# Patient Record
Sex: Male | Born: 1940 | Race: White | Hispanic: No | Marital: Married | Smoking: Current every day smoker
Health system: Southern US, Community
[De-identification: ages and names within clinical notes are randomized; demographics above are authoritative.]

## PROBLEM LIST (undated history)

## (undated) DIAGNOSIS — C61 Malignant neoplasm of prostate: Secondary | ICD-10-CM

## (undated) DIAGNOSIS — I6529 Occlusion and stenosis of unspecified carotid artery: Secondary | ICD-10-CM

## (undated) DIAGNOSIS — I499 Cardiac arrhythmia, unspecified: Secondary | ICD-10-CM

## (undated) DIAGNOSIS — D369 Benign neoplasm, unspecified site: Secondary | ICD-10-CM

## (undated) DIAGNOSIS — I25810 Atherosclerosis of coronary artery bypass graft(s) without angina pectoris: Secondary | ICD-10-CM

## (undated) DIAGNOSIS — Z87442 Personal history of urinary calculi: Secondary | ICD-10-CM

## (undated) DIAGNOSIS — I2510 Atherosclerotic heart disease of native coronary artery without angina pectoris: Secondary | ICD-10-CM

## (undated) DIAGNOSIS — M1990 Unspecified osteoarthritis, unspecified site: Secondary | ICD-10-CM

## (undated) DIAGNOSIS — I10 Essential (primary) hypertension: Secondary | ICD-10-CM

## (undated) DIAGNOSIS — E785 Hyperlipidemia, unspecified: Secondary | ICD-10-CM

## (undated) DIAGNOSIS — E119 Type 2 diabetes mellitus without complications: Secondary | ICD-10-CM

## (undated) DIAGNOSIS — K219 Gastro-esophageal reflux disease without esophagitis: Secondary | ICD-10-CM

## (undated) HISTORY — DX: Hyperlipidemia, unspecified: E78.5

## (undated) HISTORY — PX: LITHOTRIPSY: SUR834

## (undated) HISTORY — DX: Occlusion and stenosis of unspecified carotid artery: I65.29

## (undated) HISTORY — PX: OTHER SURGICAL HISTORY: SHX169

## (undated) HISTORY — PX: COLON RESECTION: SHX5231

## (undated) HISTORY — PX: CORONARY ARTERY BYPASS GRAFT: SHX141

---

## 2004-09-29 ENCOUNTER — Ambulatory Visit: Payer: Self-pay

## 2006-02-01 ENCOUNTER — Ambulatory Visit: Payer: Self-pay | Admitting: Gastroenterology

## 2007-03-25 ENCOUNTER — Ambulatory Visit: Payer: Self-pay | Admitting: Gastroenterology

## 2008-09-28 ENCOUNTER — Emergency Department: Payer: Self-pay | Admitting: Emergency Medicine

## 2008-11-14 ENCOUNTER — Ambulatory Visit: Payer: Self-pay | Admitting: Urology

## 2008-11-19 ENCOUNTER — Ambulatory Visit: Payer: Self-pay | Admitting: Urology

## 2008-11-20 ENCOUNTER — Ambulatory Visit: Payer: Self-pay | Admitting: Urology

## 2008-11-29 ENCOUNTER — Ambulatory Visit: Payer: Self-pay | Admitting: Urology

## 2008-12-14 ENCOUNTER — Ambulatory Visit: Payer: Self-pay | Admitting: Urology

## 2009-01-15 ENCOUNTER — Ambulatory Visit: Payer: Self-pay | Admitting: Urology

## 2009-02-04 ENCOUNTER — Ambulatory Visit: Payer: Self-pay | Admitting: Urology

## 2009-02-14 ENCOUNTER — Ambulatory Visit: Payer: Self-pay | Admitting: Gastroenterology

## 2012-05-06 ENCOUNTER — Ambulatory Visit: Payer: Self-pay | Admitting: Gastroenterology

## 2012-05-09 LAB — PATHOLOGY REPORT

## 2014-10-08 ENCOUNTER — Ambulatory Visit: Payer: Self-pay | Admitting: Urology

## 2014-10-10 ENCOUNTER — Ambulatory Visit: Payer: Self-pay | Admitting: Urology

## 2014-10-16 ENCOUNTER — Ambulatory Visit: Payer: Self-pay | Admitting: Urology

## 2014-10-23 ENCOUNTER — Ambulatory Visit: Payer: Self-pay | Admitting: Urology

## 2014-12-17 LAB — SURGICAL PATHOLOGY

## 2014-12-23 NOTE — H&P (Signed)
PATIENT NAME:  Kenneth [name], Kenneth [name] MR#:  [id number] DATE OF BIRTH:  04/28/1941  DATE OF ADMISSION:  10/23/2014  CHIEF COMPLAINT: Prostate cancer.   HISTORY OF PRESENT ILLNESS: Mr. 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 Staging studies included CT scan and bone scan revealing metastatic disease to the pelvic lymph nodes. The patient is currently on Casodex 50 mg a day and finasteride 5 mg per day. He comes in today for bilateral orchiectomy.   ALLERGIES: PERCOCET.   CURRENT MEDICATIONS: Include lovastatin, atenolol, Prilosec, aspirin, finasteride and Casodex.   PAST SURGICAL HISTORY: Include coronary artery bypass graft x2 in 1996 and colon resection due to colon polyps in 2001.   SOCIAL HISTORY: The patient smokes half-pack a day and has a 40 pack-year history. He consumes 2 to 4 alcoholic beverages per week.   FAMILY HISTORY: Remarkable for heart disease.   PAST AND CURRENT MEDICAL CONDITIONS:  1.  Coronary artery disease.  2.  Hypercholesterolemia.  3.  Hypertension. 4.  GERD.   REVIEW OF SYSTEMS: The patient denied weight loss, bone pain, fatigue, night sweats, chest pain, shortness of breath, diabetes or stroke.   PHYSICAL EXAMINATION: GENERAL: Elderly white male in no acute distress.  HEENT: Sclerae were clear. Pupils were equally round, reactive to light and accommodation. Extraocular movements were intact.  NECK: Supple. No palpable cervical lymphadenopathy. No audible carotid bruits.  LUNGS: Clear to auscultation.  CARDIOVASCULAR: Regular rhythm and rate without audible murmurs.  ABDOMEN: Soft, nontender abdomen.  GENITOURINARY: Circumcised. Testes smooth and nontender, 20 mL in size each.  RECTAL: 45 gram smooth nontender prostate.  NEUROMUSCULAR: Alert and oriented x3.   IMPRESSION: High-grade and metastatic prostate cancer.    PLAN: Bilateral orchiectomy.  ____________________________ Otelia Limes. Yves Dill, MD [email or web address] D: 10/16/2014 11:49:43 ET T: 10/16/2014 12:05:33 ET JOB#: [id number]  cc: Otelia Limes. Yves Dill, MD, <Dictator> Royston Cowper MD ELECTRONICALLY SIGNED 10/16/2014 15:44

## 2014-12-23 NOTE — Op Note (Signed)
PATIENT NAME:  Kenneth [name], Kenneth [name] MR#:  [id number] DATE OF BIRTH:  1941/04/30  DATE OF PROCEDURE:  10/23/2014  PREOPERATIVE DIAGNOSIS: Prostate cancer.   POSTOPERATIVE DIAGNOSIS: Prostate cancer.   PROCEDURE: Bilateral orchiectomy.   SURGEON: Maryan Puls, MD.   ANESTHETIST:  Dr. Kayleen Memos and Dr. Yves Dill.   ANESTHETIC METHOD: General per Dr. Kayleen Memos and spermatic cord block per Dr. Yves Dill.   INDICATIONS: See the dictated history and physical. After informed consent the patient requested the above procedure.   OPERATIVE SUMMARY: After adequate general anesthesia had been obtained perineum was prepped and draped in the usual fashion. Bilateral spermatic cord blocks were performed with a solution of 1% Xylocaine and 0.5% Marcaine. A total of 10 mL was injected into each spermatic cord. After adequate local anesthesia had been obtained midline raphe scrotal incision was made and carried down sharply through the skin and through the dartos fascia. The right testicle was delivered into the surgical field. The right vas deferens was identified, divided, and ligated with 2-0 Chromic suture. The right spermatic cord vascular structures were then sequentially suture ligated with a 2-0 Chromic suture. The procedure was repeated on the left side in an identical fashion. At this point dartos fascia was approximated with running 2-0 Chromic suture. Scrotal skin was reapproximated with interrupted 2-0 Chromic suture. Sterile dressing and scrotal supporter were applied. Sponge, needle, and instrument counts were noted to be correct. The patient was then transferred to the recovery room in stable condition.    ____________________________ Otelia Limes. Yves Dill, MD [email or web address] D: 10/23/2014 16:48:03 ET T: 10/23/2014 17:42:06 ET JOB#: [id number]  cc: Otelia Limes. Yves Dill, MD, <Dictator> Royston Cowper MD ELECTRONICALLY SIGNED 10/25/2014 9:51

## 2015-02-01 ENCOUNTER — Other Ambulatory Visit: Payer: Self-pay | Admitting: Internal Medicine

## 2015-02-01 DIAGNOSIS — R0989 Other specified symptoms and signs involving the circulatory and respiratory systems: Secondary | ICD-10-CM

## 2015-02-05 ENCOUNTER — Ambulatory Visit
Admission: RE | Admit: 2015-02-05 | Discharge: 2015-02-05 | Disposition: A | Discharge status: Home / Self Care | Payer: Medicare Other | Source: Ambulatory Visit | Attending: Internal Medicine | Admitting: Internal Medicine

## 2015-02-05 DIAGNOSIS — E119 Type 2 diabetes mellitus without complications: Secondary | ICD-10-CM | POA: Insufficient documentation

## 2015-02-05 DIAGNOSIS — I6523 Occlusion and stenosis of bilateral carotid arteries: Secondary | ICD-10-CM | POA: Diagnosis not present

## 2015-02-05 DIAGNOSIS — R0989 Other specified symptoms and signs involving the circulatory and respiratory systems: Secondary | ICD-10-CM

## 2015-02-15 ENCOUNTER — Other Ambulatory Visit: Payer: Self-pay | Admitting: Internal Medicine

## 2015-02-15 DIAGNOSIS — R0989 Other specified symptoms and signs involving the circulatory and respiratory systems: Secondary | ICD-10-CM

## 2015-06-21 ENCOUNTER — Encounter: Payer: Self-pay | Admitting: *Deleted

## 2015-06-24 ENCOUNTER — Encounter
Admission: RE | Disposition: A | Discharge status: Home / Self Care | Payer: Self-pay | Source: Ambulatory Visit | Attending: Gastroenterology

## 2015-06-24 ENCOUNTER — Ambulatory Visit: Payer: Medicare Other | Admitting: *Deleted

## 2015-06-24 ENCOUNTER — Ambulatory Visit
Admission: RE | Admit: 2015-06-24 | Discharge: 2015-06-24 | Disposition: A | Discharge status: Home / Self Care | Payer: Medicare Other | Source: Ambulatory Visit | Attending: Gastroenterology | Admitting: Gastroenterology

## 2015-06-24 DIAGNOSIS — E669 Obesity, unspecified: Secondary | ICD-10-CM | POA: Diagnosis not present

## 2015-06-24 DIAGNOSIS — D120 Benign neoplasm of cecum: Secondary | ICD-10-CM | POA: Insufficient documentation

## 2015-06-24 DIAGNOSIS — Z885 Allergy status to narcotic agent status: Secondary | ICD-10-CM | POA: Insufficient documentation

## 2015-06-24 DIAGNOSIS — Z79899 Other long term (current) drug therapy: Secondary | ICD-10-CM | POA: Diagnosis not present

## 2015-06-24 DIAGNOSIS — D123 Benign neoplasm of transverse colon: Secondary | ICD-10-CM | POA: Diagnosis not present

## 2015-06-24 DIAGNOSIS — D125 Benign neoplasm of sigmoid colon: Secondary | ICD-10-CM | POA: Diagnosis not present

## 2015-06-24 DIAGNOSIS — D122 Benign neoplasm of ascending colon: Secondary | ICD-10-CM | POA: Insufficient documentation

## 2015-06-24 DIAGNOSIS — K5730 Diverticulosis of large intestine without perforation or abscess without bleeding: Secondary | ICD-10-CM | POA: Diagnosis not present

## 2015-06-24 DIAGNOSIS — Z1211 Encounter for screening for malignant neoplasm of colon: Secondary | ICD-10-CM | POA: Diagnosis not present

## 2015-06-24 DIAGNOSIS — E119 Type 2 diabetes mellitus without complications: Secondary | ICD-10-CM | POA: Diagnosis not present

## 2015-06-24 DIAGNOSIS — Z86010 Personal history of colonic polyps: Secondary | ICD-10-CM | POA: Insufficient documentation

## 2015-06-24 DIAGNOSIS — Z7982 Long term (current) use of aspirin: Secondary | ICD-10-CM | POA: Diagnosis not present

## 2015-06-24 DIAGNOSIS — Z6829 Body mass index (BMI) 29.0-29.9, adult: Secondary | ICD-10-CM | POA: Insufficient documentation

## 2015-06-24 DIAGNOSIS — D128 Benign neoplasm of rectum: Secondary | ICD-10-CM | POA: Insufficient documentation

## 2015-06-24 HISTORY — DX: Type 2 diabetes mellitus without complications: E11.9

## 2015-06-24 HISTORY — DX: Malignant neoplasm of prostate: C61

## 2015-06-24 HISTORY — PX: COLONOSCOPY WITH PROPOFOL: SHX5780

## 2015-06-24 HISTORY — DX: Atherosclerosis of coronary artery bypass graft(s) without angina pectoris: I25.810

## 2015-06-24 HISTORY — DX: Benign neoplasm, unspecified site: D36.9

## 2015-06-24 LAB — GLUCOSE, CAPILLARY: Glucose-Capillary: 225 mg/dL — ABNORMAL HIGH (ref 65–99)

## 2015-06-24 SURGERY — COLONOSCOPY WITH PROPOFOL
Anesthesia: General

## 2015-06-24 MED ORDER — SODIUM CHLORIDE 0.9 % IV SOLN
INTRAVENOUS | Status: DC
  Administered 2015-06-24: 09:00:00 via INTRAVENOUS

## 2015-06-24 MED ORDER — PROPOFOL 500 MG/50ML IV EMUL
INTRAVENOUS | Status: DC | PRN
Start: 2015-06-24 — End: 2015-06-24
  Administered 2015-06-24: 120 ug/kg/min via INTRAVENOUS

## 2015-06-24 NOTE — Anesthesia Preprocedure Evaluation (Signed)
Anesthesia Evaluation  Patient identified by MRN, date of birth, ID band Patient awake    Reviewed: Allergy & Precautions, NPO status , Patient's Chart, lab work & pertinent test results  Airway Mallampati: II  TM Distance: >3 FB Neck ROM: Limited    Dental  (+) Edentulous Upper, Edentulous Lower   Pulmonary    Pulmonary exam normal        Cardiovascular Exercise Tolerance: Good hypertension, Pt. on medications and Pt. on home beta blockers + CAD and + CABG  Normal cardiovascular exam  CABG in the 1990s and says he has done well since--can walk up a flight of stairs.   Neuro/Psych    GI/Hepatic   Endo/Other  diabetes, Type 2, Oral Hypoglycemic Agents  Renal/GU      Musculoskeletal   Abdominal (+) + obese,  Abdomen: soft.    Peds  Hematology   Anesthesia Other Findings   Reproductive/Obstetrics                             Anesthesia Physical Anesthesia Plan  ASA: III  Anesthesia Plan: General   Post-op Pain Management:    Induction: Intravenous  Airway Management Planned: Nasal Cannula  Additional Equipment:   Intra-op Plan:   Post-operative Plan:   Informed Consent: I have reviewed the patients History and Physical, chart, labs and discussed the procedure including the risks, benefits and alternatives for the proposed anesthesia with the patient or authorized representative who has indicated his/her understanding and acceptance.     Plan Discussed with:   Anesthesia Plan Comments:         Anesthesia Quick Evaluation

## 2015-06-24 NOTE — Transfer of Care (Signed)
Immediate Anesthesia Transfer of Care Note  Patient: Kenneth [name]  Procedure(s) Performed: Procedure(s): COLONOSCOPY WITH PROPOFOL (N/A)  Patient Location: PACU and Endoscopy Unit  Anesthesia Type:General  Level of Consciousness: awake, alert  and oriented  Airway & Oxygen Therapy: Patient Spontanous Breathing and Patient connected to nasal cannula oxygen  Post-op Assessment: Report given to RN and Post -op Vital signs reviewed and stable  Post vital signs: Reviewed and stable  Last Vitals:  Filed Vitals:   06/24/15 0938  BP: 107/56  Pulse: 55  Temp: 35.8 C  Resp: 16    Complications: No apparent anesthesia complications

## 2015-06-24 NOTE — Op Note (Signed)
[facility] Gastroenterology Patient Name: Kenneth [name] Procedure Date: 06/24/2015 8:59 AM MRN: [id number] Account #: [id number] Date of Birth: [birth year] Admit Type: Outpatient Age: 74 Room: [facility] ENDO ROOM 3 Gender: Male Note Status: Finalized Procedure:         Colonoscopy Indications:       Personal history of colonic polyps Providers:         Lollie Sails, MD Referring MD:      Leona Carry. Hall Busing, MD (Referring MD) Medicines:         Monitored Anesthesia Care Complications:     No immediate complications. Procedure:         Pre-Anesthesia Assessment:                    - ASA Grade Assessment: III - A patient with severe                     systemic disease.                    After obtaining informed consent, the colonoscope was                     passed under direct vision. Throughout the procedure, the                     patient's blood pressure, pulse, and oxygen saturations                     were monitored continuously. The Colonoscope was                     introduced through the anus and advanced to the the cecum,                     identified by appendiceal orifice and ileocecal valve. The                     colonoscopy was performed without difficulty. The patient                     tolerated the procedure well. The patient tolerated the                     procedure well. The quality of the bowel preparation was                     good except the ascending colon was fair. Findings:      A 3 mm polyp was found in the recto-sigmoid colon. The polyp was       sessile. The polyp was removed with a cold biopsy forceps. Resection and       retrieval were complete.      A 2 mm polyp was found in the sigmoid colon. The polyp was flat. The       polyp was removed with a cold biopsy forceps. Resection and retrieval       were complete.      A 3 mm polyp was found in the cecum. The polyp was flat. The polyp was       removed with a cold biopsy  forceps. Resection and retrieval were       complete.      A 2 mm polyp was found in the ascending colon. The polyp was flat. The  polyp was removed with a cold biopsy forceps. Resection and retrieval       were complete.      A 2 mm polyp was found in the transverse colon. The polyp was sessile.       The polyp was removed with a cold biopsy forceps. Resection and       retrieval were complete.      Two sessile polyps were found in the distal sigmoid colon. The polyps       were 1 to 2 mm in size.      A few small-mouthed diverticula were found in the descending colon, in       the transverse colon and in the ascending colon.      the previously noted colonic anastomosis in the sigmoid colon was       inapparent on examination today Impression:        - One 3 mm polyp at the recto-sigmoid colon. Resected and                     retrieved.                    - One 2 mm polyp in the sigmoid colon. Resected and                     retrieved.                    - One 3 mm polyp in the cecum. Resected and retrieved.                    - One 2 mm polyp in the ascending colon. Resected and                     retrieved.                    - One 2 mm polyp in the transverse colon. Resected and                     retrieved.                    - Two 1 to 2 mm polyps in the distal sigmoid colon.                    - Diverticulosis in the descending colon, in the                     transverse colon and in the ascending colon. Recommendation:    - Await pathology results.                    - Telephone GI clinic for pathology results in 1 week. Procedure Code(s): --- Professional ---                    [phone], Colonoscopy, flexible; with biopsy, single or                     multiple Diagnosis Code(s): --- Professional ---                    211.4, Benign neoplasm of rectum and anal canal                    211.3, Benign neoplasm  of colon                    V12.72, Personal history of  colonic polyps                    562.10, Diverticulosis of colon (without mention of                     hemorrhage) CPT copyright 2014 American Medical Association. All rights reserved. The codes documented in this report are preliminary and upon coder review may  be revised to meet current compliance requirements. Lollie Sails, MD 06/24/2015 9:34:47 AM This report has been signed electronically. Number of Addenda: 0 Note Initiated On: 06/24/2015 8:59 AM Scope Withdrawal Time: 0 hours 14 minutes 23 seconds  Total Procedure Duration: 0 hours 20 minutes 44 seconds       [facility]

## 2015-06-24 NOTE — Anesthesia Postprocedure Evaluation (Signed)
  Anesthesia Post-op Note  Patient: Kenneth [name]  Procedure(s) Performed: Procedure(s): COLONOSCOPY WITH PROPOFOL (N/A)  Anesthesia type:General  Patient location: PACU  Post pain: Pain level controlled  Post assessment: Post-op Vital signs reviewed, Patient's Cardiovascular Status Stable, Respiratory Function Stable, Patent Airway and No signs of Nausea or vomiting  Post vital signs: Reviewed and stable  Last Vitals:  Filed Vitals:   06/24/15 0950  BP: 121/61  Pulse: 52  Temp:   Resp: 22    Level of consciousness: awake, alert  and patient cooperative  Complications: No apparent anesthesia complications

## 2015-06-24 NOTE — H&P (Signed)
Outpatient short stay form Pre-procedure 06/24/2015 8:56 AM Kenneth Sails MD  Primary Physician: Dr. Benita Stabile  Reason for visit:  Colonoscopy  History of present illness:  Patient is a 74 year old male presenting today for a colonoscopy. He has a history of adenomatous colon polyps. He had a partial colectomy, sigmoid, several years ago due to a endoscopically nonresectable polyp. Tolerated his prep well. He takes no blood thinners with the exception of a minidose aspirin. He is held that.    Current facility-administered medications:  .  0.9 %  sodium chloride infusion, , Intravenous, Continuous, Kenneth Sails, MD, Last Rate: 20 mL/hr at 06/24/15 8469  Prescriptions prior to admission  Medication Sig Dispense Refill Last Dose  . aspirin 81 MG tablet Take 81 mg by mouth daily.     Marland Kitchen atenolol (TENORMIN) 25 MG tablet Take 25 mg by mouth daily.   06/24/2015 at 0700  . finasteride (PROSCAR) 5 MG tablet Take 5 mg by mouth daily.     Marland Kitchen lovastatin (MEVACOR) 20 MG tablet Take 20 mg by mouth at bedtime.     Marland Kitchen omeprazole (PRILOSEC) 20 MG capsule Take 20 mg by mouth every other day.     . sitaGLIPtin (JANUVIA) 100 MG tablet Take 100 mg by mouth daily.     . tamsulosin (FLOMAX) 0.4 MG CAPS capsule Take 0.4 mg by mouth.        Allergies  Allergen Reactions  . Percocet [Oxycodone-Acetaminophen]      Past Medical History  Diagnosis Date  . Adenomatous polyps   . Diabetes mellitus without complication ([place])   . Prostate cancer ([place])   . Prostate cancer ([place])   . Coronary atherosclerosis of artery bypass graft     Review of systems:      Physical Exam    Heart and lungs: Regular rate and rhythm without rub or gallop, lungs are bilaterally clear    HEENT: Normocephalic atraumatic eyes are anicteric    Other:     Pertinant exam for procedure: Soft nontender nondistended bowel sounds positive normoactive    Planned proceedures: Colonoscopy and indicated procedures I  have discussed the risks benefits and complications of procedures to include not limited to bleeding, infection, perforation and the risk of sedation and the patient wishes to proceed.    Kenneth Sails, MD Gastroenterology 06/24/2015  8:56 AM

## 2015-06-25 ENCOUNTER — Encounter: Payer: Self-pay | Admitting: Gastroenterology

## 2015-06-26 LAB — SURGICAL PATHOLOGY

## 2015-08-09 ENCOUNTER — Ambulatory Visit
Admission: RE | Admit: 2015-08-09 | Discharge: 2015-08-09 | Disposition: A | Discharge status: Home / Self Care | Payer: Medicare Other | Source: Ambulatory Visit | Attending: Internal Medicine | Admitting: Internal Medicine

## 2015-08-09 DIAGNOSIS — I6523 Occlusion and stenosis of bilateral carotid arteries: Secondary | ICD-10-CM | POA: Diagnosis not present

## 2015-08-09 DIAGNOSIS — R0989 Other specified symptoms and signs involving the circulatory and respiratory systems: Secondary | ICD-10-CM | POA: Insufficient documentation

## 2015-08-27 ENCOUNTER — Other Ambulatory Visit: Payer: Self-pay | Admitting: Vascular Surgery

## 2015-08-27 DIAGNOSIS — I6523 Occlusion and stenosis of bilateral carotid arteries: Secondary | ICD-10-CM

## 2015-09-02 ENCOUNTER — Ambulatory Visit
Admission: RE | Admit: 2015-09-02 | Discharge: 2015-09-02 | Disposition: A | Discharge status: Home / Self Care | Payer: Medicare Other | Source: Ambulatory Visit | Attending: Vascular Surgery | Admitting: Vascular Surgery

## 2015-09-02 DIAGNOSIS — I6523 Occlusion and stenosis of bilateral carotid arteries: Secondary | ICD-10-CM

## 2015-09-02 DIAGNOSIS — I6521 Occlusion and stenosis of right carotid artery: Secondary | ICD-10-CM | POA: Diagnosis not present

## 2015-09-02 DIAGNOSIS — I6501 Occlusion and stenosis of right vertebral artery: Secondary | ICD-10-CM | POA: Diagnosis not present

## 2015-09-02 LAB — POCT I-STAT CREATININE: CREATININE: 0.8 mg/dL (ref 0.61–1.24)

## 2015-09-02 MED ORDER — IOHEXOL 350 MG/ML SOLN
75.0000 mL | Freq: Once | INTRAVENOUS | Status: AC | PRN
  Administered 2015-09-02: 75 mL via INTRAVENOUS

## 2015-09-06 ENCOUNTER — Encounter: Payer: Self-pay | Admitting: Cardiovascular Disease

## 2015-09-06 ENCOUNTER — Ambulatory Visit (INDEPENDENT_AMBULATORY_CARE_PROVIDER_SITE_OTHER): Payer: Medicare Other | Admitting: Cardiovascular Disease

## 2015-09-06 VITALS — BP 124/60 | HR 56 | Ht 69.0 in | Wt 212.5 lb

## 2015-09-06 DIAGNOSIS — R5383 Other fatigue: Secondary | ICD-10-CM | POA: Diagnosis not present

## 2015-09-06 DIAGNOSIS — Z01810 Encounter for preprocedural cardiovascular examination: Secondary | ICD-10-CM

## 2015-09-06 DIAGNOSIS — E785 Hyperlipidemia, unspecified: Secondary | ICD-10-CM

## 2015-09-06 DIAGNOSIS — Z01818 Encounter for other preprocedural examination: Secondary | ICD-10-CM | POA: Diagnosis not present

## 2015-09-06 NOTE — Assessment & Plan Note (Signed)
The patient has no symptoms of angina and he is physically active with no significant exertional dyspnea. Nonetheless, he had CABG in 1996 and has old grafts with no recent ischemic cardiac evaluation. Thus, I recommend a pharmacologic nuclear stress test for evaluation. If this comes back unremarkable, he can proceed with carotid endarterectomy at an overall low risk. Continue low-dose aspirin.

## 2015-09-06 NOTE — Patient Instructions (Addendum)
Medication Instructions:  Your physician recommends that you continue on your current medications as directed. Please refer to the Current Medication list given to you today.   Labwork: none  Testing/Procedures: Your physician has requested that you have a lexiscan myoview. For further information please visit [email or web address]. Please follow instruction sheet, as given.    [place]  Your caregiver has ordered a Stress Test with nuclear imaging. The purpose of this test is to evaluate the blood supply to your heart muscle. This procedure is referred to as a "Non-Invasive Stress Test." This is because other than having an IV started in your vein, nothing is inserted or "invades" your body. Cardiac stress tests are done to find areas of poor blood flow to the heart by determining the extent of coronary artery disease (CAD). Some patients exercise on a treadmill, which naturally increases the blood flow to your heart, while others who are  unable to walk on a treadmill due to physical limitations have a pharmacologic/chemical stress agent called Lexiscan . This medicine will mimic walking on a treadmill by temporarily increasing your coronary blood flow.   Please note: these test may take anywhere between 2-4 hours to complete  PLEASE REPORT TO [place] AT THE FIRST DESK WILL DIRECT YOU WHERE TO GO  Date of Procedure:___Tuesday, Jan 17_______________  Arrival Time for Procedure:__________8:45am____________  Instructions regarding medication:   ___xx_ : Hold diabetes medication morning of procedure  _xx___:  Hold atenolol night before procedure and morning of procedure    PLEASE NOTIFY THE OFFICE AT LEAST 24 HOURS IN ADVANCE IF YOU ARE UNABLE TO KEEP YOUR APPOINTMENT.  [phone] AND  PLEASE NOTIFY NUCLEAR MEDICINE AT [facility] AT LEAST 24 HOURS IN ADVANCE IF YOU ARE UNABLE TO KEEP YOUR APPOINTMENT. [phone]  How to prepare for your Myoview  test:   Do not eat or drink after midnight  No caffeine for 24 hours prior to test  No smoking 24 hours prior to test.  Your medication may be taken with water.  If your doctor stopped a medication because of this test, do not take that medication.  Ladies, please do not wear dresses.  Skirts or pants are appropriate. Please wear a short sleeve shirt.  No perfume, cologne or lotion.  Wear comfortable walking shoes. No heels!            Follow-Up: Your physician recommends that you schedule a follow-up appointment as needed.   Any Other Special Instructions Will Be Listed Below (If Applicable).     If you need a refill on your cardiac medications before your next appointment, please call your pharmacy.  Cardiac Nuclear Scanning A cardiac nuclear scan is used to check your heart for problems, such as the following:  A portion of the heart is not getting enough blood.  Part of the heart muscle has died, which happens with a heart attack.  The heart wall is not working normally.  In this test, a radioactive dye (tracer) is injected into your bloodstream. After the tracer has traveled to your heart, a scanning device is used to measure how much of the tracer is absorbed by or distributed to various areas of your heart. LET [facility] CARE PROVIDER KNOW ABOUT:  Any allergies you have.  All medicines you are taking, including vitamins, herbs, eye drops, creams, and over-the-counter medicines.  Previous problems you or members of your family have had with the use of anesthetics.  Any blood disorders  you have.  Previous surgeries you have had.  Medical conditions you have.  RISKS AND COMPLICATIONS Generally, this is a safe procedure. However, as with any procedure, problems can occur. Possible problems include:   Serious chest pain.  Rapid heartbeat.  Sensation of warmth in your chest. This usually passes quickly. BEFORE THE PROCEDURE Ask your health care  provider about changing or stopping your regular medicines. PROCEDURE This procedure is usually done at a hospital and takes 2-4 hours.  An IV tube is inserted into one of your veins.  Your health care provider will inject a small amount of radioactive tracer through the tube.  You will then wait for 20-40 minutes while the tracer travels through your bloodstream.  You will lie down on an exam table so images of your heart can be taken. Images will be taken for about 15-20 minutes.  You will exercise on a treadmill or stationary bike. While you exercise, your heart activity will be monitored with an electrocardiogram (ECG), and your blood pressure will be checked.  If you are unable to exercise, you may be given a medicine to make your heart beat faster.  When blood flow to your heart has peaked, tracer will again be injected through the IV tube.  After 20-40 minutes, you will get back on the exam table and have more images taken of your heart.  When the procedure is over, your IV tube will be removed. AFTER THE PROCEDURE  You will likely be able to leave shortly after the test. Unless your health care provider tells you otherwise, you may return to your normal schedule, including diet, activities, and medicines.  Make sure you find out how and when you will get your test results.   This information is not intended to replace advice given to you by your health care provider. Make sure you discuss any questions you have with your health care provider.   Document Released: 09/04/2004 Document Revised: 08/15/2013 Document Reviewed: 07/19/2013 Elsevier Interactive Patient Education [employer].

## 2015-09-06 NOTE — Assessment & Plan Note (Signed)
Continue treatment with lovastatin with a target LDL of less than 70.

## 2015-09-06 NOTE — Progress Notes (Signed)
Primary care physician: Dr. Hall Busing.   HPI  This is a pleasant 75 year old male who was referred by Dr. Lucky Cowboy for preoperative cardiovascular evaluation before carotid endarterectomy. The patient has known history of coronary artery disease status post CABG in 1996 at [facility] with no cardiac events since then. He has other chronic medical conditions that include hyperlipidemia, type 2 diabetes and tobacco use. He also had prostate cancer and had partial colectomy in the past. He was found to have a recent right carotid bruit. Duplex confirmed high-grade stenosis. He denies any chest pain or significant shortness of breath. He is very active and plays golf on a regular basis with no significant exertional symptoms. He reports having no stress test in the last 5 years. He continues to smoke about 13 cigarettes a day and has been doing so since he was 75 years old. He drinks beer on occasions but not very frequently.  Allergies  Allergen Reactions  . Percocet [Oxycodone-Acetaminophen]      Current Outpatient Prescriptions on File Prior to Visit  Medication Sig Dispense Refill  . aspirin 81 MG tablet Take 81 mg by mouth daily.    Marland Kitchen atenolol (TENORMIN) 25 MG tablet Take 25 mg by mouth daily.    . finasteride (PROSCAR) 5 MG tablet Take 5 mg by mouth daily.    Marland Kitchen lovastatin (MEVACOR) 20 MG tablet Take 20 mg by mouth at bedtime.    Marland Kitchen omeprazole (PRILOSEC) 20 MG capsule Take 20 mg by mouth every other day.    . sitaGLIPtin (JANUVIA) 100 MG tablet Take 100 mg by mouth daily.    . tamsulosin (FLOMAX) 0.4 MG CAPS capsule Take 0.4 mg by mouth.     No current facility-administered medications on file prior to visit.     Past Medical History  Diagnosis Date  . Adenomatous polyps   . Diabetes mellitus without complication ([place])   . Coronary atherosclerosis of artery bypass graft   . Prostate cancer ([place])   . Prostate cancer ([place])   . Hyperlipidemia   . Carotid artery occlusion      Past Surgical  History  Procedure Laterality Date  . Removal of testicles    . Colonoscopy with propofol N/A 06/24/2015    Procedure: COLONOSCOPY WITH PROPOFOL;  Surgeon: Lollie Sails, MD;  Location: [facility] ENDOSCOPY;  Service: Endoscopy;  Laterality: N/A;  . Coronary artery bypass graft      1996 at [place] History  Problem Relation Age of Onset  . Family history: No family history of coronary artery disease or heart failure.      Social History   Social History  . Marital Status: Married    Spouse Name: N/A  . Number of Children: N/A  . Years of Education: N/A   Occupational History  . Not on file.   Social History Main Topics  . Smoking status: Current Every Day Smoker -- 15.00 packs/day for 50 years    Types: Cigarettes  . Smokeless tobacco: Not on file  . Alcohol Use: No  . Drug Use: No  . Sexual Activity: Not on file   Other Topics Concern  . Not on file   Social History Narrative     ROS A 10 point review of system was performed. It is negative other than that mentioned in the history of present illness.   PHYSICAL EXAM   BP 124/60 mmHg  Pulse 56  Ht 5\' 9"  (1.753 m)  Wt 212 lb  8 oz (96.389 kg)  BMI 31.37 kg/m2 Constitutional: He is oriented to person, place, and time. He appears well-developed and well-nourished. No distress.  HENT: No nasal discharge.  Head: Normocephalic and atraumatic.  Eyes: Pupils are equal and round.  No discharge. Neck: Normal range of motion. Neck supple. No JVD present. No thyromegaly present. Right carotid bruit. Cardiovascular: Normal rate, regular rhythm, normal heart sounds. Exam reveals no gallop and no friction rub. No murmur heard.  Pulmonary/Chest: Effort normal and breath sounds normal. No stridor. No respiratory distress. He has no wheezes. He has no rales. He exhibits no tenderness.  Abdominal: Soft. Bowel sounds are normal. He exhibits no distension. There is no tenderness. There is no rebound and no guarding.    Musculoskeletal: Normal range of motion. He exhibits no edema and no tenderness.  Neurological: He is alert and oriented to person, place, and time. Coordination normal.  Skin: Skin is warm and dry. No rash noted. He is not diaphoretic. No erythema. No pallor.  Psychiatric: He has a normal mood and affect. His behavior is normal. Judgment and thought content normal.       EKG: Sinus bradycardia with no significant ST or T wave changes.   ASSESSMENT AND PLAN

## 2015-09-09 ENCOUNTER — Telehealth: Payer: Self-pay

## 2015-09-09 NOTE — Telephone Encounter (Signed)
Reviewed lexi myoview instructions w/pt who verbalized understanding. Will cardiac clearance sent to [facility], Dr. Lucky Cowboy, after test.

## 2015-09-10 ENCOUNTER — Ambulatory Visit
Admission: RE | Admit: 2015-09-10 | Discharge: 2015-09-10 | Disposition: A | Discharge status: Home / Self Care | Payer: Medicare Other | Source: Ambulatory Visit | Attending: Cardiovascular Disease | Admitting: Cardiovascular Disease

## 2015-09-10 DIAGNOSIS — Z79899 Other long term (current) drug therapy: Secondary | ICD-10-CM

## 2015-09-10 DIAGNOSIS — Z01818 Encounter for other preprocedural examination: Secondary | ICD-10-CM | POA: Diagnosis not present

## 2015-09-10 LAB — NM MYOCAR MULTI W/SPECT W/WALL MOTION / EF
CHL CUP NUCLEAR SSS: 25
CHL CUP STRESS STAGE 1 HR: 60 {beats}/min
CHL CUP STRESS STAGE 2 SPEED: 0 mph
CHL CUP STRESS STAGE 3 HR: 60 {beats}/min
CHL CUP STRESS STAGE 3 SPEED: 0 mph
CHL CUP STRESS STAGE 4 GRADE: 0 %
CHL CUP STRESS STAGE 5 GRADE: 0 %
CHL CUP STRESS STAGE 5 SPEED: 0 mph
CSEPEW: 1 METS
CSEPHR: 75 %
CSEPPHR: 109 {beats}/min
CSEPPMHR: 75 %
LVDIAVOL: 159 mL
LVSYSVOL: 77 mL
NUC STRESS TID: 0.87
SDS: 3
SRS: 2
Stage 2 Grade: 0 %
Stage 2 HR: 60 {beats}/min
Stage 3 Grade: 0 %
Stage 4 HR: 109 {beats}/min
Stage 4 Speed: 0 mph
Stage 5 DBP: 65 mmHg
Stage 5 HR: 73 {beats}/min
Stage 5 SBP: 165 mmHg

## 2015-09-10 MED ORDER — TECHNETIUM TC 99M SESTAMIBI - CARDIOLITE
12.5100 | Freq: Once | INTRAVENOUS | Status: AC | PRN
  Administered 2015-09-10: 09:00:00 12.51 via INTRAVENOUS

## 2015-09-10 MED ORDER — REGADENOSON 0.4 MG/5ML IV SOLN
0.4000 mg | Freq: Once | INTRAVENOUS | Status: AC
  Administered 2015-09-10: 0.4 mg via INTRAVENOUS

## 2015-09-10 MED ORDER — TECHNETIUM TC 99M SESTAMIBI - CARDIOLITE
32.2380 | Freq: Once | INTRAVENOUS | Status: AC | PRN
  Administered 2015-09-10: 32.238 via INTRAVENOUS

## 2015-09-11 ENCOUNTER — Telehealth: Payer: Self-pay | Admitting: Cardiovascular Disease

## 2015-09-11 NOTE — Telephone Encounter (Signed)
Patient wants nm stress results from yesterday.  Please call .

## 2015-09-11 NOTE — Telephone Encounter (Signed)
See result note.  

## 2015-09-12 ENCOUNTER — Telehealth: Payer: Self-pay

## 2015-09-12 NOTE — Telephone Encounter (Signed)
Faxed clearance to [facility], [phone]

## 2015-09-18 ENCOUNTER — Encounter
Admission: RE | Admit: 2015-09-18 | Discharge: 2015-09-18 | Disposition: A | Discharge status: Home / Self Care | Payer: Medicare Other | Source: Ambulatory Visit | Attending: Vascular Surgery | Admitting: Vascular Surgery

## 2015-09-18 ENCOUNTER — Other Ambulatory Visit: Payer: Self-pay | Admitting: Vascular Surgery

## 2015-09-18 DIAGNOSIS — Z01812 Encounter for preprocedural laboratory examination: Secondary | ICD-10-CM | POA: Insufficient documentation

## 2015-09-18 HISTORY — DX: Gastro-esophageal reflux disease without esophagitis: K21.9

## 2015-09-18 LAB — TYPE AND SCREEN
ABO/RH(D): A NEG
Antibody Screen: NEGATIVE

## 2015-09-18 LAB — CBC WITH DIFFERENTIAL/PLATELET
Basophils Absolute: 0 10*3/uL (ref 0–0.1)
Basophils Relative: 1 %
EOS ABS: 0 10*3/uL (ref 0–0.7)
Eosinophils Relative: 0 %
HEMATOCRIT: 43.8 % (ref 40.0–52.0)
HEMOGLOBIN: 14.7 g/dL (ref 13.0–18.0)
LYMPHS ABS: 2.6 10*3/uL (ref 1.0–3.6)
Lymphocytes Relative: 34 %
MCH: 28.8 pg (ref 26.0–34.0)
MCHC: 33.5 g/dL (ref 32.0–36.0)
MCV: 85.8 fL (ref 80.0–100.0)
MONO ABS: 0.6 10*3/uL (ref 0.2–1.0)
MONOS PCT: 7 %
NEUTROS ABS: 4.3 10*3/uL (ref 1.4–6.5)
NEUTROS PCT: 58 %
Platelets: 136 10*3/uL — ABNORMAL LOW (ref 150–440)
RBC: 5.11 MIL/uL (ref 4.40–5.90)
RDW: 13.5 % (ref 11.5–14.5)
WBC: 7.6 10*3/uL (ref 3.8–10.6)

## 2015-09-18 LAB — BASIC METABOLIC PANEL
Anion gap: 12 (ref 5–15)
BUN: 14 mg/dL (ref 6–20)
CHLORIDE: 103 mmol/L (ref 101–111)
CO2: 22 mmol/L (ref 22–32)
CREATININE: 0.8 mg/dL (ref 0.61–1.24)
Calcium: 9.5 mg/dL (ref 8.9–10.3)
GFR calc non Af Amer: >60 mL/min (ref >60)
GLUCOSE: 175 mg/dL — AB (ref 65–99)
Potassium: 4 mmol/L (ref 3.5–5.1)
Sodium: 137 mmol/L (ref 135–145)

## 2015-09-18 LAB — PROTIME-INR
INR: 1.01
Prothrombin Time: 13.5 seconds (ref 11.4–15.0)

## 2015-09-18 LAB — SURGICAL PCR SCREEN
MRSA, PCR: NEGATIVE
STAPHYLOCOCCUS AUREUS: NEGATIVE

## 2015-09-18 LAB — APTT: aPTT: 27 seconds (ref 24–36)

## 2015-09-18 LAB — ABO/RH: ABO/RH(D): A NEG

## 2015-09-18 NOTE — Pre-Procedure Instructions (Signed)
Kenneth Hampshire, MD at 09/06/2015 9:05 AM     Status: Signed       Expand All Collapse All    Primary care physician: Dr. Hall Busing.   HPI  This is a pleasant 75 year old male who was referred by Dr. Lucky Cowboy for preoperative cardiovascular evaluation before carotid endarterectomy. The patient has known history of coronary artery disease status post CABG in 1996 at [facility] with no cardiac events since then. He has other chronic medical conditions that include hyperlipidemia, type 2 diabetes and tobacco use. He also had prostate cancer and had partial colectomy in the past. He was found to have a recent right carotid bruit. Duplex confirmed high-grade stenosis. He denies any chest pain or significant shortness of breath. He is very active and plays golf on a regular basis with no significant exertional symptoms. He reports having no stress test in the last 5 years. He continues to smoke about 13 cigarettes a day and has been doing so since he was 75 years old. He drinks beer on occasions but not very frequently.  Allergies  Allergen Reactions  . Percocet [Oxycodone-Acetaminophen]      Current Outpatient Prescriptions on File Prior to Visit  Medication Sig Dispense Refill  . aspirin 81 MG tablet Take 81 mg by mouth daily.    Marland Kitchen atenolol (TENORMIN) 25 MG tablet Take 25 mg by mouth daily.    . finasteride (PROSCAR) 5 MG tablet Take 5 mg by mouth daily.    Marland Kitchen lovastatin (MEVACOR) 20 MG tablet Take 20 mg by mouth at bedtime.    Marland Kitchen omeprazole (PRILOSEC) 20 MG capsule Take 20 mg by mouth every other day.    . sitaGLIPtin (JANUVIA) 100 MG tablet Take 100 mg by mouth daily.    . tamsulosin (FLOMAX) 0.4 MG CAPS capsule Take 0.4 mg by mouth.     No current facility-administered medications on file prior to visit.     Past Medical History  Diagnosis Date  . Adenomatous polyps   . Diabetes mellitus without complication ([place])   . Coronary  atherosclerosis of artery bypass graft   . Prostate cancer ([place])   . Prostate cancer ([place])   . Hyperlipidemia   . Carotid artery occlusion

## 2015-09-18 NOTE — Patient Instructions (Signed)
  Your procedure is scheduled on: 09/25/15 Wed Report to Day Surgery.2nd floor medical mall To find out your arrival time please call [phone] between 1PM - 3PM on 09/24/15 Tues.  Remember: Instructions that are not followed completely may result in serious medical risk, up to and including death, or upon the discretion of your surgeon and anesthesiologist your surgery may need to be rescheduled.    _x___ 1. Do not eat food or drink liquids after midnight. No gum chewing or hard candies.     _x___ 2. No Alcohol for 24 hours before or after surgery.   ____ 3. Bring all medications with you on the day of surgery if instructed.    __x__ 4. Notify your doctor if there is any change in your medical condition     (cold, fever, infections).     Do not wear jewelry, make-up, hairpins, clips or nail polish.  Do not wear lotions, powders, or perfumes. You may wear deodorant.  Do not shave 48 hours prior to surgery. Men may shave face and neck.  Do not bring valuables to the hospital.    [facility] is not responsible for any belongings or valuables.               Contacts, dentures or bridgework may not be worn into surgery.  Leave your suitcase in the car. After surgery it may be brought to your room.  For patients admitted to the hospital, discharge time is determined by your                treatment team.   Patients discharged the day of surgery will not be allowed to drive home.   Please read over the following fact sheets that you were given:   MRSA Information   _x___ Take these medicines the morning of surgery with A SIP OF WATER:    1. atenolol (TENORMIN) 25 MG tablet  2. omeprazole (PRILOSEC) 20 MG capsule  3.   4.  5.  6.  ____ Fleet Enema (as directed)   _x___ Use CHG Soap as directed  ____ Use inhalers on the day of surgery  ____ Stop metformin 2 days prior to surgery    ____ Take 1/2 of usual insulin dose the night before surgery and none on the morning of  surgery.   ____ Stop Coumadin/Plavix/aspirin on   ____ Stop Anti-inflammatories on    ____ Stop supplements until after surgery.    ____ Bring C-Pap to the hospital.

## 2015-09-25 ENCOUNTER — Inpatient Hospital Stay: Payer: Medicare Other | Admitting: Anesthesiology

## 2015-09-25 ENCOUNTER — Inpatient Hospital Stay
Admission: RE | Admit: 2015-09-25 | Discharge: 2015-09-26 | DRG: 039 | Disposition: A | Discharge status: Home / Self Care | Payer: Medicare Other | Source: Ambulatory Visit | Attending: Vascular Surgery | Admitting: Vascular Surgery

## 2015-09-25 ENCOUNTER — Encounter
Admission: RE | Disposition: A | Discharge status: Home / Self Care | Payer: Self-pay | Source: Ambulatory Visit | Attending: Vascular Surgery

## 2015-09-25 DIAGNOSIS — Z8546 Personal history of malignant neoplasm of prostate: Secondary | ICD-10-CM | POA: Diagnosis not present

## 2015-09-25 DIAGNOSIS — E119 Type 2 diabetes mellitus without complications: Secondary | ICD-10-CM | POA: Diagnosis present

## 2015-09-25 DIAGNOSIS — Z9889 Other specified postprocedural states: Secondary | ICD-10-CM

## 2015-09-25 DIAGNOSIS — I2510 Atherosclerotic heart disease of native coronary artery without angina pectoris: Secondary | ICD-10-CM | POA: Diagnosis present

## 2015-09-25 DIAGNOSIS — Z79899 Other long term (current) drug therapy: Secondary | ICD-10-CM

## 2015-09-25 DIAGNOSIS — K219 Gastro-esophageal reflux disease without esophagitis: Secondary | ICD-10-CM | POA: Diagnosis present

## 2015-09-25 DIAGNOSIS — I6529 Occlusion and stenosis of unspecified carotid artery: Secondary | ICD-10-CM | POA: Diagnosis present

## 2015-09-25 DIAGNOSIS — Z833 Family history of diabetes mellitus: Secondary | ICD-10-CM

## 2015-09-25 DIAGNOSIS — Z9079 Acquired absence of other genital organ(s): Secondary | ICD-10-CM | POA: Diagnosis not present

## 2015-09-25 DIAGNOSIS — I6521 Occlusion and stenosis of right carotid artery: Secondary | ICD-10-CM | POA: Diagnosis present

## 2015-09-25 DIAGNOSIS — Z7982 Long term (current) use of aspirin: Secondary | ICD-10-CM | POA: Diagnosis not present

## 2015-09-25 DIAGNOSIS — F17210 Nicotine dependence, cigarettes, uncomplicated: Secondary | ICD-10-CM | POA: Diagnosis present

## 2015-09-25 DIAGNOSIS — Z885 Allergy status to narcotic agent status: Secondary | ICD-10-CM | POA: Diagnosis not present

## 2015-09-25 HISTORY — PX: ENDARTERECTOMY: SHX5162

## 2015-09-25 LAB — GLUCOSE, CAPILLARY
GLUCOSE-CAPILLARY: 170 mg/dL — AB (ref 65–99)
Glucose-Capillary: 178 mg/dL — ABNORMAL HIGH (ref 65–99)
Glucose-Capillary: 164 mg/dL — ABNORMAL HIGH (ref 65–99)

## 2015-09-25 LAB — MRSA PCR SCREENING: MRSA BY PCR: NEGATIVE

## 2015-09-25 SURGERY — ENDARTERECTOMY, CAROTID
Anesthesia: General | Laterality: Right | Wound class: Clean

## 2015-09-25 MED ORDER — GLIPIZIDE 5 MG PO TABS
5.0000 mg | ORAL_TABLET | Freq: Every day | ORAL | Status: DC
  Administered 2015-09-26: 5 mg via ORAL
  Filled 2015-09-25: qty 1

## 2015-09-25 MED ORDER — PHENOL 1.4 % MT LIQD
1.0000 | OROMUCOSAL | Status: DC | PRN
  Filled 2015-09-25: qty 177

## 2015-09-25 MED ORDER — DOCUSATE SODIUM 100 MG PO CAPS
100.0000 mg | ORAL_CAPSULE | Freq: Every day | ORAL | Status: DC
  Administered 2015-09-26: 100 mg via ORAL
  Filled 2015-09-25: qty 1

## 2015-09-25 MED ORDER — ONDANSETRON HCL 4 MG/2ML IJ SOLN
INTRAMUSCULAR | Status: DC | PRN
  Administered 2015-09-25: 4 mg via INTRAVENOUS

## 2015-09-25 MED ORDER — DOPAMINE-DEXTROSE 3.2-5 MG/ML-% IV SOLN: 3.0000 ug/kg/min | INTRAVENOUS | Status: DC

## 2015-09-25 MED ORDER — FENTANYL CITRATE (PF) 100 MCG/2ML IJ SOLN
INTRAMUSCULAR | Status: DC | PRN
  Administered 2015-09-25 (×3): 50 ug via INTRAVENOUS

## 2015-09-25 MED ORDER — CETYLPYRIDINIUM CHLORIDE 0.05 % MT LIQD
7.0000 mL | Freq: Two times a day (BID) | OROMUCOSAL | Status: DC
  Administered 2015-09-25 (×2): 7 mL via OROMUCOSAL

## 2015-09-25 MED ORDER — CLOPIDOGREL BISULFATE 75 MG PO TABS
75.0000 mg | ORAL_TABLET | Freq: Every day | ORAL | Status: DC
  Administered 2015-09-26: 75 mg via ORAL
  Filled 2015-09-25: qty 1

## 2015-09-25 MED ORDER — IPRATROPIUM-ALBUTEROL 0.5-2.5 (3) MG/3ML IN SOLN
RESPIRATORY_TRACT | Status: AC
Start: 2015-09-25 — End: 2015-09-25
  Administered 2015-09-25: 3 mL via RESPIRATORY_TRACT
  Filled 2015-09-25: qty 3

## 2015-09-25 MED ORDER — LABETALOL HCL 5 MG/ML IV SOLN
10.0000 mg | INTRAVENOUS | Status: DC | PRN
Start: 2015-09-25 — End: 2015-09-26

## 2015-09-25 MED ORDER — FENTANYL CITRATE (PF) 100 MCG/2ML IJ SOLN: 25.0000 ug | INTRAMUSCULAR | Status: DC | PRN

## 2015-09-25 MED ORDER — HEPARIN SODIUM (PORCINE) 1000 UNIT/ML IJ SOLN
INTRAMUSCULAR | Status: DC | PRN
  Administered 2015-09-25: 6000 [IU] via INTRAVENOUS

## 2015-09-25 MED ORDER — ACETAMINOPHEN 325 MG PO TABS: 325.0000 mg | ORAL_TABLET | ORAL | Status: DC | PRN

## 2015-09-25 MED ORDER — HYDROCODONE-ACETAMINOPHEN 5-325 MG PO TABS
1.0000 | ORAL_TABLET | Freq: Four times a day (QID) | ORAL | Status: DC | PRN
Start: 2015-09-25 — End: 2015-09-26

## 2015-09-25 MED ORDER — HEPARIN SODIUM (PORCINE) 1000 UNIT/ML IJ SOLN
INTRAMUSCULAR | Status: AC
  Filled 2015-09-25: qty 1

## 2015-09-25 MED ORDER — LIDOCAINE HCL 1 % IJ SOLN
INTRAMUSCULAR | Status: DC | PRN
  Administered 2015-09-25: 10 mL

## 2015-09-25 MED ORDER — SODIUM CHLORIDE 0.9 % IV SOLN
10000.0000 ug | INTRAVENOUS | Status: DC | PRN
  Administered 2015-09-25: 50 ug/min via INTRAVENOUS

## 2015-09-25 MED ORDER — SODIUM CHLORIDE 0.9 % IV SOLN
INTRAVENOUS | Status: DC
  Administered 2015-09-25: 14:00:00 via INTRAVENOUS

## 2015-09-25 MED ORDER — SODIUM CHLORIDE 0.9 % IV SOLN
INTRAVENOUS | Status: DC | PRN
  Administered 2015-09-25: 120 mL

## 2015-09-25 MED ORDER — MAGNESIUM SULFATE 2 GM/50ML IV SOLN
2.0000 g | Freq: Every day | INTRAVENOUS | Status: DC | PRN
  Filled 2015-09-25: qty 50

## 2015-09-25 MED ORDER — ROCURONIUM BROMIDE 100 MG/10ML IV SOLN
INTRAVENOUS | Status: DC | PRN
  Administered 2015-09-25: 10 mg via INTRAVENOUS
  Administered 2015-09-25: 30 mg via INTRAVENOUS
  Administered 2015-09-25: 50 mg via INTRAVENOUS
  Administered 2015-09-25: 5 mg via INTRAVENOUS
  Administered 2015-09-25: 10 mg via INTRAVENOUS

## 2015-09-25 MED ORDER — FENTANYL CITRATE (PF) 100 MCG/2ML IJ SOLN
INTRAMUSCULAR | Status: AC
  Administered 2015-09-25: 50 ug via INTRAVENOUS
  Filled 2015-09-25: qty 2

## 2015-09-25 MED ORDER — SUGAMMADEX SODIUM 200 MG/2ML IV SOLN
INTRAVENOUS | Status: DC | PRN
  Administered 2015-09-25: 192.4 mg via INTRAVENOUS

## 2015-09-25 MED ORDER — ATENOLOL 50 MG PO TABS
25.0000 mg | ORAL_TABLET | Freq: Every day | ORAL | Status: DC
  Administered 2015-09-26: 25 mg via ORAL
  Filled 2015-09-25: qty 1

## 2015-09-25 MED ORDER — HYDRALAZINE HCL 20 MG/ML IJ SOLN: 5.0000 mg | INTRAMUSCULAR | Status: DC | PRN

## 2015-09-25 MED ORDER — GLYCOPYRROLATE 0.2 MG/ML IJ SOLN
INTRAMUSCULAR | Status: DC | PRN
  Administered 2015-09-25 (×2): 0.2 mg via INTRAVENOUS

## 2015-09-25 MED ORDER — POTASSIUM CHLORIDE CRYS ER 20 MEQ PO TBCR: 20.0000 meq | EXTENDED_RELEASE_TABLET | Freq: Every day | ORAL | Status: DC | PRN

## 2015-09-25 MED ORDER — ALUM & MAG HYDROXIDE-SIMETH 200-200-20 MG/5ML PO SUSP: 15.0000 mL | ORAL | Status: DC | PRN

## 2015-09-25 MED ORDER — ASPIRIN EC 81 MG PO TBEC
81.0000 mg | DELAYED_RELEASE_TABLET | Freq: Every day | ORAL | Status: DC
  Administered 2015-09-25 – 2015-09-26 (×2): 81 mg via ORAL
  Filled 2015-09-25 (×2): qty 1

## 2015-09-25 MED ORDER — METOPROLOL TARTRATE 1 MG/ML IV SOLN: 2.0000 mg | INTRAVENOUS | Status: DC | PRN

## 2015-09-25 MED ORDER — NITROGLYCERIN IN D5W 200-5 MCG/ML-% IV SOLN
INTRAVENOUS | Status: AC
  Filled 2015-09-25: qty 250

## 2015-09-25 MED ORDER — DEXTROSE 5 % IV SOLN
1.5000 g | Freq: Two times a day (BID) | INTRAVENOUS | Status: AC
Start: 2015-09-25 — End: 2015-09-26
  Administered 2015-09-25 – 2015-09-26 (×2): 1.5 g via INTRAVENOUS
  Filled 2015-09-25 (×3): qty 1.5

## 2015-09-25 MED ORDER — MORPHINE SULFATE (PF) 2 MG/ML IV SOLN
2.0000 mg | INTRAVENOUS | Status: DC | PRN
Start: 2015-09-25 — End: 2015-09-26
  Administered 2015-09-25 – 2015-09-26 (×6): 2 mg via INTRAVENOUS
  Filled 2015-09-25 (×6): qty 1

## 2015-09-25 MED ORDER — MIDAZOLAM HCL 2 MG/2ML IJ SOLN
INTRAMUSCULAR | Status: DC | PRN
  Administered 2015-09-25 (×2): 1 mg via INTRAVENOUS

## 2015-09-25 MED ORDER — ONDANSETRON HCL 4 MG/2ML IJ SOLN: 4.0000 mg | Freq: Four times a day (QID) | INTRAMUSCULAR | Status: DC | PRN

## 2015-09-25 MED ORDER — SODIUM CHLORIDE 0.9 % IV SOLN
INTRAVENOUS | Status: DC | PRN
  Administered 2015-09-25: 07:00:00 via INTRAVENOUS

## 2015-09-25 MED ORDER — SODIUM CHLORIDE 0.9 % IV SOLN
INTRAVENOUS | Status: DC
  Administered 2015-09-25 (×2): via INTRAVENOUS

## 2015-09-25 MED ORDER — NITROGLYCERIN IN D5W 200-5 MCG/ML-% IV SOLN: 5.0000 ug/min | INTRAVENOUS | Status: DC

## 2015-09-25 MED ORDER — PANTOPRAZOLE SODIUM 40 MG PO TBEC
40.0000 mg | DELAYED_RELEASE_TABLET | Freq: Every day | ORAL | Status: DC
  Administered 2015-09-26: 40 mg via ORAL
  Filled 2015-09-25: qty 1

## 2015-09-25 MED ORDER — FENTANYL CITRATE (PF) 100 MCG/2ML IJ SOLN
25.0000 ug | INTRAMUSCULAR | Status: DC | PRN
  Administered 2015-09-25 (×2): 25 ug via INTRAVENOUS
  Administered 2015-09-25 (×2): 50 ug via INTRAVENOUS

## 2015-09-25 MED ORDER — LIDOCAINE HCL (CARDIAC) 20 MG/ML IV SOLN
INTRAVENOUS | Status: DC | PRN
  Administered 2015-09-25: 80 mg via INTRAVENOUS

## 2015-09-25 MED ORDER — FAMOTIDINE IN NACL 20-0.9 MG/50ML-% IV SOLN
20.0000 mg | Freq: Two times a day (BID) | INTRAVENOUS | Status: DC
  Administered 2015-09-25 – 2015-09-26 (×3): 20 mg via INTRAVENOUS
  Filled 2015-09-25 (×4): qty 50

## 2015-09-25 MED ORDER — PHENYLEPHRINE HCL 10 MG/ML IJ SOLN
INTRAMUSCULAR | Status: DC | PRN
  Administered 2015-09-25 (×3): 200 ug via INTRAVENOUS
  Administered 2015-09-25: 100 ug via INTRAVENOUS
  Administered 2015-09-25: 200 ug via INTRAVENOUS

## 2015-09-25 MED ORDER — ACETAMINOPHEN 650 MG RE SUPP: 325.0000 mg | RECTAL | Status: DC | PRN

## 2015-09-25 MED ORDER — LIDOCAINE HCL (PF) 1 % IJ SOLN
INTRAMUSCULAR | Status: AC
  Filled 2015-09-25: qty 2

## 2015-09-25 MED ORDER — GUAIFENESIN-DM 100-10 MG/5ML PO SYRP: 15.0000 mL | ORAL_SOLUTION | ORAL | Status: DC | PRN

## 2015-09-25 MED ORDER — CEFAZOLIN SODIUM 1 G IJ SOLR
INTRAMUSCULAR | Status: AC
  Filled 2015-09-25: qty 10

## 2015-09-25 MED ORDER — CEFAZOLIN SODIUM-DEXTROSE 2-3 GM-% IV SOLR
INTRAVENOUS | Status: AC
Start: 2015-09-25 — End: 2015-09-25
  Filled 2015-09-25: qty 50

## 2015-09-25 MED ORDER — SODIUM CHLORIDE FLUSH 0.9 % IV SOLN
INTRAVENOUS | Status: AC
Start: 2015-09-25 — End: 2015-09-25
  Filled 2015-09-25: qty 10

## 2015-09-25 MED ORDER — ESMOLOL HCL-SODIUM CHLORIDE 2000 MG/100ML IV SOLN
25.0000 ug/kg/min | INTRAVENOUS | Status: DC
  Filled 2015-09-25: qty 100

## 2015-09-25 MED ORDER — FINASTERIDE 5 MG PO TABS
5.0000 mg | ORAL_TABLET | Freq: Every day | ORAL | Status: DC
  Administered 2015-09-25 – 2015-09-26 (×2): 5 mg via ORAL
  Filled 2015-09-25 (×2): qty 1

## 2015-09-25 MED ORDER — CETYLPYRIDINIUM CHLORIDE 0.05 % MT LIQD
7.0000 mL | Freq: Two times a day (BID) | OROMUCOSAL | Status: DC
Start: 2015-09-25 — End: 2015-09-26

## 2015-09-25 MED ORDER — CEFAZOLIN SODIUM-DEXTROSE 2-3 GM-% IV SOLR
2.0000 g | INTRAVENOUS | Status: AC
  Administered 2015-09-25: 2 g via INTRAVENOUS

## 2015-09-25 MED ORDER — LIDOCAINE HCL (PF) 1 % IJ SOLN
INTRAMUSCULAR | Status: AC
  Filled 2015-09-25: qty 30

## 2015-09-25 MED ORDER — EPHEDRINE SULFATE 50 MG/ML IJ SOLN
INTRAMUSCULAR | Status: DC | PRN
  Administered 2015-09-25 (×2): 5 mg via INTRAVENOUS

## 2015-09-25 MED ORDER — SODIUM CHLORIDE 0.9 % IV SOLN
INTRAVENOUS | Status: DC | PRN
  Administered 2015-09-25: 100 mL via INTRAMUSCULAR

## 2015-09-25 MED ORDER — PRAVASTATIN SODIUM 20 MG PO TABS
20.0000 mg | ORAL_TABLET | Freq: Every day | ORAL | Status: DC
  Administered 2015-09-25: 20 mg via ORAL
  Filled 2015-09-25: qty 1

## 2015-09-25 MED ORDER — SODIUM CHLORIDE 0.9 % IV SOLN: 500.0000 mL | Freq: Once | INTRAVENOUS | Status: DC | PRN

## 2015-09-25 MED ORDER — IPRATROPIUM-ALBUTEROL 0.5-2.5 (3) MG/3ML IN SOLN
3.0000 mL | Freq: Once | RESPIRATORY_TRACT | Status: AC
  Administered 2015-09-25: 3 mL via RESPIRATORY_TRACT

## 2015-09-25 MED ORDER — LINAGLIPTIN 5 MG PO TABS
5.0000 mg | ORAL_TABLET | Freq: Every day | ORAL | Status: DC
  Administered 2015-09-25 – 2015-09-26 (×2): 5 mg via ORAL
  Filled 2015-09-25 (×2): qty 1

## 2015-09-25 MED ORDER — PROPOFOL 10 MG/ML IV BOLUS
INTRAVENOUS | Status: DC | PRN
  Administered 2015-09-25: 120 mg via INTRAVENOUS

## 2015-09-25 MED ORDER — TAMSULOSIN HCL 0.4 MG PO CAPS
0.4000 mg | ORAL_CAPSULE | Freq: Every day | ORAL | Status: DC
  Administered 2015-09-26: 0.4 mg via ORAL
  Filled 2015-09-25: qty 1

## 2015-09-25 SURGICAL SUPPLY — 59 items
BAG DECANTER FOR FLEXI CONT (MISCELLANEOUS) ×3 IMPLANT
BLADE SURG 15 STRL LF DISP TIS (BLADE) ×1 IMPLANT
BLADE SURG 15 STRL SS (BLADE) ×2
BLADE SURG SZ11 CARB STEEL (BLADE) ×3 IMPLANT
BOOT SUTURE AID YELLOW STND (SUTURE) ×3 IMPLANT
BRUSH SCRUB 4% CHG (MISCELLANEOUS) ×3 IMPLANT
CANISTER SUCT 1200ML W/VALVE (MISCELLANEOUS) ×3 IMPLANT
CATH TRAY 16F METER LATEX (MISCELLANEOUS) ×3 IMPLANT
DRAPE INCISE IOBAN 66X45 STRL (DRAPES) ×3 IMPLANT
DRAPE LAPAROTOMY 77X122 PED (DRAPES) ×3 IMPLANT
DRAPE SHEET LG 3/4 BI-LAMINATE (DRAPES) ×3 IMPLANT
DRSG TEGADERM 4X4.75 (GAUZE/BANDAGES/DRESSINGS) IMPLANT
DRSG TELFA 3X8 NADH (GAUZE/BANDAGES/DRESSINGS) IMPLANT
DURAPREP 26ML APPLICATOR (WOUND CARE) ×3 IMPLANT
ELECT CAUTERY BLADE 6.4 (BLADE) ×3 IMPLANT
ELECT REM PT RETURN 9FT ADLT (ELECTROSURGICAL) ×3
ELECTRODE REM PT RTRN 9FT ADLT (ELECTROSURGICAL) ×1 IMPLANT
EVICEL 2ML SEALANT HUMAN (Miscellaneous) ×3 IMPLANT
GLOVE BIO SURGEON STRL SZ7 (GLOVE) ×15 IMPLANT
GOWN STRL REUS W/ TWL LRG LVL3 (GOWN DISPOSABLE) ×2 IMPLANT
GOWN STRL REUS W/ TWL XL LVL3 (GOWN DISPOSABLE) ×1 IMPLANT
GOWN STRL REUS W/TWL LRG LVL3 (GOWN DISPOSABLE) ×4
GOWN STRL REUS W/TWL XL LVL3 (GOWN DISPOSABLE) ×2
HEMOSTAT SURGICEL 2X3 (HEMOSTASIS) ×3 IMPLANT
IV NS 250ML (IV SOLUTION) ×2
IV NS 250ML BAXH (IV SOLUTION) ×1 IMPLANT
KIT RM TURNOVER STRD PROC AR (KITS) ×3 IMPLANT
LABEL OR SOLS (LABEL) ×3 IMPLANT
LIQUID BAND (GAUZE/BANDAGES/DRESSINGS) ×3 IMPLANT
LOOP RED MAXI  1X406MM (MISCELLANEOUS) ×4
LOOP VESSEL MAXI 1X406 RED (MISCELLANEOUS) ×2 IMPLANT
LOOP VESSEL MINI 0.8X406 BLUE (MISCELLANEOUS) ×1 IMPLANT
LOOPS BLUE MINI 0.8X406MM (MISCELLANEOUS) ×2
NEEDLE FILTER BLUNT 18X 1/2SAF (NEEDLE) ×2
NEEDLE FILTER BLUNT 18X1 1/2 (NEEDLE) ×1 IMPLANT
NEEDLE HYPO 25X1 1.5 SAFETY (NEEDLE) ×3 IMPLANT
NS IRRIG 1000ML POUR BTL (IV SOLUTION) ×3 IMPLANT
PACK BASIN MAJOR ARMC (MISCELLANEOUS) ×3 IMPLANT
PATCH CAROTID ECM VASC 1X10 (Prosthesis & Implant Heart) ×3 IMPLANT
PENCIL ELECTRO HAND CTR (MISCELLANEOUS) IMPLANT
SHUNT W TPORT 9FR PRUITT F3 (SHUNT) ×3 IMPLANT
SUT MNCRL 4-0 (SUTURE) ×2
SUT MNCRL 4-0 27XMFL (SUTURE) ×1
SUT PROLENE 6 0 BV (SUTURE) ×27 IMPLANT
SUT PROLENE 7 0 BV 1 (SUTURE) ×6 IMPLANT
SUT SILK 2 0 (SUTURE) ×2
SUT SILK 2-0 18XBRD TIE 12 (SUTURE) ×1 IMPLANT
SUT SILK 3 0 (SUTURE) ×2
SUT SILK 3-0 18XBRD TIE 12 (SUTURE) ×1 IMPLANT
SUT SILK 4 0 (SUTURE) ×2
SUT SILK 4-0 18XBRD TIE 12 (SUTURE) ×1 IMPLANT
SUT VIC AB 3-0 SH 27 (SUTURE) ×4
SUT VIC AB 3-0 SH 27X BRD (SUTURE) ×2 IMPLANT
SUTURE MNCRL 4-0 27XMF (SUTURE) ×1 IMPLANT
SYR 20CC LL (SYRINGE) ×3 IMPLANT
SYRINGE 10CC LL (SYRINGE) ×6 IMPLANT
TOWEL OR 17X26 4PK STRL BLUE (TOWEL DISPOSABLE) IMPLANT
TUBING CONNECTING 10 (TUBING) IMPLANT
TUBING CONNECTING 10' (TUBING)

## 2015-09-25 NOTE — Transfer of Care (Deleted)
Immediate Anesthesia Transfer of Care Note  Patient: Kenneth [name]  Procedure(s) Performed: Procedure(s): ENDARTERECTOMY CAROTID (Right)  Patient Location: PACU  Anesthesia Type:General  Level of Consciousness: awake, alert  and oriented  Airway & Oxygen Therapy: Patient Spontanous Breathing and Patient connected to face mask oxygen  Post-op Assessment: Report given to RN and Post -op Vital signs reviewed and stable  Post vital signs: Reviewed and stable  Last Vitals:  Filed Vitals:   09/25/15 0607 09/25/15 0610  BP: 153/74   Pulse: 61   Temp:  35.7 C  Resp: 16     Complications: No apparent anesthesia complications

## 2015-09-25 NOTE — Anesthesia Preprocedure Evaluation (Signed)
Anesthesia Evaluation  Patient identified by MRN, date of birth, ID band Patient awake    Reviewed: Allergy & Precautions, H&P , NPO status , Patient's Chart, lab work & pertinent test results  History of Anesthesia Complications Negative for: history of anesthetic complications  Airway Mallampati: III  TM Distance: <3 FB Neck ROM: full    Dental  (+) Poor Dentition, Missing, Edentulous Upper, Edentulous Lower   Pulmonary neg shortness of breath, COPD, Current Smoker,    Pulmonary exam normal breath sounds clear to auscultation       Cardiovascular Exercise Tolerance: Good (-) angina+ CAD, + Past MI, + CABG and + Peripheral Vascular Disease  (-) DOE Normal cardiovascular exam Rhythm:regular Rate:Normal     Neuro/Psych negative neurological ROS  negative psych ROS   GI/Hepatic Neg liver ROS, GERD  Controlled,  Endo/Other  diabetes, Type 2  Renal/GU negative Renal ROS  negative genitourinary   Musculoskeletal   Abdominal   Peds  Hematology negative hematology ROS (+)   Anesthesia Other Findings Past Medical History:   Adenomatous polyps                                           Diabetes mellitus without complication (HCC)                 Coronary atherosclerosis of artery bypass graft              Prostate cancer (HCC)                                        Prostate cancer ([place])                                        Hyperlipidemia                                               Carotid artery occlusion                                     GERD (gastroesophageal reflux disease)                      Past Surgical History:   Removal of Testicles                                          COLONOSCOPY WITH PROPOFOL                       N/A 06/24/2015     Comment:Procedure: COLONOSCOPY WITH PROPOFOL;  Surgeon:              Lollie Sails, MD;  [facility]               ENDOSCOPY;  Service: Endoscopy;  Laterality:  N/A;   CORONARY ARTERY BYPASS GRAFT                                    Comment:1996 at [facility] 2 vessels   COLON RESECTION                                              BMI    Body Mass Index   31.29 kg/m 2    Signs and symptoms suggestive of sleep apnea   Patient has cardiac clearance for this procedure.     Reproductive/Obstetrics negative OB ROS                             Anesthesia Physical Anesthesia Plan  ASA: IV  Anesthesia Plan: General ETT   Post-op Pain Management:    Induction:   Airway Management Planned:   Additional Equipment: Arterial line  Intra-op Plan:   Post-operative Plan:   Informed Consent: I have reviewed the patients History and Physical, chart, labs and discussed the procedure including the risks, benefits and alternatives for the proposed anesthesia with the patient or authorized representative who has indicated his/her understanding and acceptance.   Dental Advisory Given  Plan Discussed with: Anesthesiologist, CRNA and Surgeon  Anesthesia Plan Comments: (Patient informed that they are higher risk for complications from anesthesia during this procedure due to their medical history.  Patient voiced understanding. )        Anesthesia Quick Evaluation

## 2015-09-25 NOTE — Progress Notes (Signed)
ANTIBIOTIC CONSULT NOTE - INITIAL  Pharmacy Consult for Antibiotic Renal Dose Adjustment Indication: surgical prophylaxis  Allergies  Allergen Reactions  . Percocet [Oxycodone-Acetaminophen]     Patient Measurements: Height: 5\' 9"  (175.3 cm) Weight: 225 lb 8.5 oz (102.3 kg) IBW/kg (Calculated) : 70.7   Vital Signs: Temp: 97.7 F (36.5 C) (02/01 1300) Temp Source: Oral (02/01 1300) BP: 114/52 mmHg (02/01 1300) Pulse Rate: 62 (02/01 1300) Intake/Output from previous day:   Intake/Output from this shift: Total I/O In: 1900 [I.V.:1900] Out: 300 [Urine:250; Blood:50]  Labs: No results for input(s): WBC, HGB, PLT, LABCREA, CREATININE in the last 72 hours. Estimated Creatinine Clearance: 92.6 mL/min (by C-G formula based on Cr of 0.8). No results for input(s): VANCOTROUGH, VANCOPEAK, VANCORANDOM, GENTTROUGH, GENTPEAK, GENTRANDOM, TOBRATROUGH, TOBRAPEAK, TOBRARND, AMIKACINPEAK, AMIKACINTROU, AMIKACIN in the last 72 hours.   Microbiology: Recent Results (from the past 720 hour(s))  Surgical pcr screen     Status: None   Collection Time: 09/18/15  2:15 PM  Result Value Ref Range Status   MRSA, PCR NEGATIVE NEGATIVE Final   Staphylococcus aureus NEGATIVE NEGATIVE Final    Comment:        The Xpert SA Assay (FDA approved for NASAL specimens in patients over 15 years of age), is one component of a comprehensive surveillance program.  Test performance has been validated by [facility] for patients greater than or equal to 20 year old. It is not intended to diagnose infection nor to guide or monitor treatment.     Medical History: Past Medical History  Diagnosis Date  . Adenomatous polyps   . Diabetes mellitus without complication ([place])   . Coronary atherosclerosis of artery bypass graft   . Prostate cancer ([place])   . Prostate cancer ([place])   . Hyperlipidemia   . Carotid artery occlusion   . GERD (gastroesophageal reflux disease)     Medications:  Scheduled:  .  aspirin EC  81 mg Oral Daily  . [START ON 09/26/2015] atenolol  25 mg Oral Daily  . ceFAZolin      . cefUROXime (ZINACEF)  IV  1.5 g Intravenous Q12H  . [START ON 09/26/2015] clopidogrel  75 mg Oral Q breakfast  . [START ON 09/26/2015] docusate sodium  100 mg Oral Daily  . famotidine (PEPCID) IV  20 mg Intravenous Q12H  . finasteride  5 mg Oral Daily  . [START ON 09/26/2015] glipiZIDE  5 mg Oral QAC breakfast  . linagliptin  5 mg Oral Daily  . [START ON 09/26/2015] pantoprazole  40 mg Oral Daily  . pravastatin  20 mg Oral q1800  . sodium chloride flush      . [START ON 09/26/2015] tamsulosin  0.4 mg Oral QPC breakfast   Infusions:  . sodium chloride    . DOPamine    . esmolol    . nitroGLYCERIN     Assessment: 75 y/o M s/p CEA ordered antibiotic prophylaxis.   Plan:  Patient ordered cefuroxime 1.5 g iv q 12 hours x 2. Dosing is appropriate for renal function. Will sign off.   Ulice Dash D 09/25/2015,1:37 PM

## 2015-09-25 NOTE — H&P (Signed)
  [facility] History & Physical Update  The patient was interviewed and re-examined.  The patient's previous History and Physical has been reviewed and is unchanged.  There is no change in the plan of care. We plan to proceed with the scheduled procedure.  Bo Rogue, MD  09/25/2015, 7:30 AM

## 2015-09-25 NOTE — OR Nursing (Signed)
Sacral pad applied preop

## 2015-09-25 NOTE — Anesthesia Procedure Notes (Addendum)
Procedure Name: Intubation Performed by: Demetrius Charity Pre-anesthesia Checklist: Patient identified, Patient being monitored, Timeout performed, Emergency Drugs available and Suction available Patient Re-evaluated:Patient Re-evaluated prior to inductionOxygen Delivery Method: Circle system utilized Preoxygenation: Pre-oxygenation with 100% oxygen Intubation Type: IV induction Ventilation: Mask ventilation without difficulty Laryngoscope Size: Mac and 4 Grade View: Grade I Tube type: Oral Tube size: 7.5 mm Number of attempts: 1 Airway Equipment and Method: Stylet Placement Confirmation: ETT inserted through vocal cords under direct vision,  positive ETCO2 and breath sounds checked- equal and bilateral Secured at: 23 cm Tube secured with: Tape Dental Injury: Teeth and Oropharynx as per pre-operative assessment  Comments:    Arterial Line Placement:  Date: 09/25/2015 Time: 07:47 AM  A time-out was completed verifying correct patient, procedure, site, positioning, and special equipment if applicable.   Allen's test was performed to ensure adequate perfusion. The patient's right wrist was prepped and draped in sterile fashion.  A 20 G Arrow arterial line was introduced into the radial artery. The catheter was threaded over the guide wire and the needle was removed with appropriate pulsatile blood return. The catheter was then  then secured with a sterile Tegaderm dressing. Perfusion to the extremity distal to the point of catheter insertion was checked and found to be adequate. Attending was present for the entire procedure.  Estimated Blood Loss: minimal  The patient tolerated the procedure well and there were no immediate complications

## 2015-09-25 NOTE — Transfer of Care (Signed)
Immediate Anesthesia Transfer of Care Note  Patient: Kenneth [name]  Procedure(s) Performed: Procedure(s): ENDARTERECTOMY CAROTID (Right)  Patient Location: PACU  Anesthesia Type:General  Level of Consciousness: awake, alert  and oriented  Airway & Oxygen Therapy: Patient Spontanous Breathing and Patient connected to face mask oxygen  Post-op Assessment: Report given to RN and Post -op Vital signs reviewed and stable  Post vital signs: Reviewed and stable  Last Vitals:  Filed Vitals:   09/25/15 0607 09/25/15 0610  BP: 153/74   Pulse: 61   Temp:  35.7 C  Resp: 16     Complications: No apparent anesthesia complications

## 2015-09-25 NOTE — Op Note (Signed)
[facility]   OPERATIVE NOTE  PROCEDURE:   1.  right carotid endarterectomy with CorMatrix arterial patch reconstruction  PRE-OPERATIVE DIAGNOSIS: 1.  High grade right carotid stenosis 2.Coronary artery disease  POST-OPERATIVE DIAGNOSIS: same as above   SURGEON: Leotis Pain, MD  ASSISTANT(S): none  ANESTHESIA: general  ESTIMATED BLOOD LOSS: 50 cc  FINDING(S): 1.  right carotid plaque.  SPECIMEN(S):  Carotid plaque (sent to Pathology)  INDICATIONS:   Kenneth [name] is a 75 y.o. male who presents with right carotid stenosis of 80%.  I discussed with the patient the risks, benefits, and alternatives to carotid endarterectomy.  I discussed the differences between carotid stenting and carotid endarterectomy. I discussed the procedural details of carotid endarterectomy with the patient.  The patient is aware that the risks of carotid endarterectomy include but are not limited to: bleeding, infection, stroke, myocardial infarction, death, cranial nerve injuries both temporary and permanent, neck hematoma, possible airway compromise, labile blood pressure post-operatively, cerebral hyperperfusion syndrome, and possible need for additional interventions in the future. The patient is aware of the risks and agrees to proceed forward with the procedure.  DESCRIPTION: After full informed written consent was obtained from the patient, the patient was brought back to the operating room and placed supine upon the operating table.  Prior to induction, the patient received IV antibiotics.  After obtaining adequate anesthesia, the patient was placed into a modified beach chair position with a shoulder roll in place and the patient's neck slightly hyperextended and rotated away from the surgical site.  The patient was prepped in the standard fashion for a carotid endarterectomy.  I made an incision anterior to the sternocleidomastoid muscle and dissected down through the subcutaneous  tissue.  The platysmas was opened with electrocautery.  Then I dissected down to the internal jugular vein and facial vein.  The facial vein is ligated and divided between 2-0 silk ties.  This was dissected posteriorly until I obtained visualization of the common carotid artery.  This was dissected out and then a vessel loop was placed around the common carotid artery. Due to a long plaque, I extended my dissection several cm proximal on the common carotid artery for the control. I then dissected in a periadventitial fashion along the common carotid artery up to the bifurcation.  I then identified the external carotid artery and the superior thyroid artery.  I placed a vessel loop around the superior thyroid artery, and I also dissected out the external carotid artery and placed a vessel loop around it. In the process of this dissection, the hypoglossal nerve was identified and protected from harm.  I then dissected out the internal carotid artery until I identified an area in the internal carotid artery clearly above the stenosis.  I dissected slightly distal to this area, and placed a vessel loop around the artery.  At this point, we gave the patient 6000 units of intravenous heparin.  After this was allowed to circulate for several minutes, I pulled up control on the vessel loops to clamp the internal carotid artery, external carotid artery, superior thyroid artery, and then the common carotid artery.  I then made an arteriotomy in the common carotid artery with a 11 blade, and extended the arteriotomy with a Potts scissor down into the common carotid artery, then I carried the arteriotomy through the bifurcation into the internal carotid artery until I reached an area that was not diseased.  At this point, I took the [place]  shunt that previously been prepared and I inserted it into the internal carotid artery first, and then into the common carotid artery taking care to flush and de-air prior to release  of control. At this point, I started the endarterectomy in the common carotid artery with a Penfield elevator and carried this dissection down into the common carotid artery circumferentially.  Then I transected the plaque at a segment where it was adherent and transected the plaque with Potts scissors.  I then carried this dissection up into the external carotid artery.  The plaque was extracted by unclamping the external carotid artery and performing an eversion endarterectomy.  The dissection was then carried into the internal carotid artery where a nice feathered end point was created with gentle traction.  I passed the plaque off the field as a specimen. At this point I removed all loose flecks and remaining disease possible.  At this point, I was satisfied that the minimal remaining disease was densely adherent to the wall and wall integrity was intact. The distal endpoint was tacked down with two 7-0 Prolene sutures.  I then fashioned a CorMatrix arterial patch for the artery and sewed it in place with two running stitch of 6-0 Prolene.  I started at the distal endpoint and ran one half the length of the arteriotomy.  I then cut and beveled the patch to an appropriate length to match the arteriotomy.  I started the second 6-0 Prolene at the proximal end point.  The medial suture line was completed and the lateral suture line was run approximately one quarter the length of the arteriotomy.  Prior to completing this patch angioplasty, I removed the shunt first from the internal carotid artery, from which there was excellent backbleeding, and clamped it.  Then I removed the shunt from the common carotid artery, from which there was excellent antegrade bleeding, and then clamped it.  At this point, I allowed the external carotid artery to backbleed, which was excellent.  Then I instilled heparinized saline in this patched artery and then completed the patch angioplasty in the usual fashion.  First, I released the  clamp on the external carotid artery, then I released it on the common carotid artery.  After waiting a few seconds, I then released it on the internal carotid artery. Several minutes of pressure were held and 6-0 Prolene patch sutures were used as needed for hemostasis.  At this point, I placed Surgicel and Evicel topical hemostatic agents.  There was no more active bleeding in the surgical site.  The sternocleidomastoid space was closed with three interrupted 3-0 Vicryl sutures. I then reapproximated the platysma muscle with a running stitch of 3-0 Vicryl.  The skin was then closed with a running subcuticular 4-0 Monocryl.  The skin was then cleaned, dried and Dermabond was used to reinforce the skin closure.  The patient awakened and was taken to the recovery room in stable condition, following commands and moving all four extremities without any apparent deficits.    COMPLICATIONS: none  CONDITION: stable  Kenneth [name]  09/25/2015, 10:11 AM

## 2015-09-25 NOTE — Anesthesia Postprocedure Evaluation (Signed)
Anesthesia Post Note  Patient: Kenneth [name]  Procedure(s) Performed: Procedure(s) (LRB): ENDARTERECTOMY CAROTID (Right)  Patient location during evaluation: PACU Anesthesia Type: General Level of consciousness: awake and alert Pain management: pain level controlled Vital Signs Assessment: post-procedure vital signs reviewed and stable Respiratory status: spontaneous breathing, nonlabored ventilation, respiratory function stable and patient connected to nasal cannula oxygen Cardiovascular status: blood pressure returned to baseline and stable Postop Assessment: no signs of nausea or vomiting Anesthetic complications: no    Last Vitals:  Filed Vitals:   09/25/15 1143 09/25/15 1228  BP: 107/51 111/65  Pulse: 71 57  Temp:  36.7 C  Resp: 16 10    Last Pain:  Filed Vitals:   09/25/15 1239  PainSc: 4                  Precious Haws Areej Tayler

## 2015-09-26 LAB — CBC
HCT: 36.1 % — ABNORMAL LOW (ref 40.0–52.0)
Hemoglobin: 12.3 g/dL — ABNORMAL LOW (ref 13.0–18.0)
MCH: 29.5 pg (ref 26.0–34.0)
MCHC: 34.1 g/dL (ref 32.0–36.0)
MCV: 86.5 fL (ref 80.0–100.0)
PLATELETS: 105 10*3/uL — AB (ref 150–440)
RBC: 4.17 MIL/uL — ABNORMAL LOW (ref 4.40–5.90)
RDW: 13.3 % (ref 11.5–14.5)
WBC: 7.5 10*3/uL (ref 3.8–10.6)

## 2015-09-26 LAB — BASIC METABOLIC PANEL
ANION GAP: 4 — AB (ref 5–15)
BUN: 12 mg/dL (ref 6–20)
CO2: 28 mmol/L (ref 22–32)
Calcium: 8.5 mg/dL — ABNORMAL LOW (ref 8.9–10.3)
Chloride: 107 mmol/L (ref 101–111)
Creatinine, Ser: 0.9 mg/dL (ref 0.61–1.24)
GFR calc non Af Amer: >60 mL/min (ref >60)
GLUCOSE: 178 mg/dL — AB (ref 65–99)
Potassium: 4.1 mmol/L (ref 3.5–5.1)
Sodium: 139 mmol/L (ref 135–145)

## 2015-09-26 LAB — SURGICAL PATHOLOGY

## 2015-09-26 MED ORDER — HYDROCODONE-ACETAMINOPHEN 5-325 MG PO TABS: 1.0000 | ORAL_TABLET | Freq: Four times a day (QID) | ORAL | Status: DC | PRN

## 2015-09-26 MED ORDER — CLOPIDOGREL BISULFATE 75 MG PO TABS: 75.0000 mg | ORAL_TABLET | Freq: Every day | ORAL | Status: DC

## 2015-09-26 NOTE — Progress Notes (Signed)
Patient discharged. Discharge teaching done with patient, including smoking cessation materials. Patient able to verbalize instructions. Patient voided 300 mL after urinary catheter removed including a very small kidney stone. Prescriptions given to patient along with information materials on new medication, clopidogrel. Patient's questions answered. Wife at bedside awaiting volunteer, so patient can be wheeled out to the car.

## 2015-09-26 NOTE — Addendum Note (Signed)
Addendum  created 09/26/15 0719 by Demetrius Charity, CRNA   Modules edited: Clinical Notes   Clinical Notes:  File: [id number]

## 2015-09-26 NOTE — Anesthesia Postprocedure Evaluation (Signed)
Anesthesia Post Note  Patient: Kenneth [name]  Procedure(s) Performed: Procedure(s) (LRB): ENDARTERECTOMY CAROTID (Right)  Patient location during evaluation: ICU Anesthesia Type: General Level of consciousness: awake and alert and oriented Pain management: satisfactory to patient Vital Signs Assessment: post-procedure vital signs reviewed and stable Respiratory status: respiratory function stable Cardiovascular status: stable    Last Vitals:  Filed Vitals:   09/26/15 0100 09/26/15 0200  BP: 116/49 113/49  Pulse: 56 56  Temp:    Resp: 11 12    Last Pain:  Filed Vitals:   09/26/15 0257  PainSc: 4                  Blima Singer

## 2015-09-26 NOTE — Discharge Summary (Signed)
[facility]    Discharge Summary    Patient ID:  Kenneth [name] MRN: [id number] DOB/AGE: [birth year] 77 y.o.  Admit date: 09/25/2015 Discharge date: 09/26/2015 Date of Surgery: 09/25/2015 Surgeon: Surgeon(s): Algernon Huxley, MD  Admission Diagnosis: CAROTID ARTERY STENOSIS  Discharge Diagnoses:  CAROTID ARTERY STENOSIS  Secondary Diagnoses: Past Medical History  Diagnosis Date  . Adenomatous polyps   . Diabetes mellitus without complication ([place])   . Coronary atherosclerosis of artery bypass graft   . Prostate cancer ([place])   . Prostate cancer ([place])   . Hyperlipidemia   . Carotid artery occlusion   . GERD (gastroesophageal reflux disease)     Procedure(s): ENDARTERECTOMY CAROTID  Discharged Condition: good  HPI:  Patient brought in for elective carotid intervention.    Hospital Course:  Kenneth [name] is a 75 y.o. male is S/P right Procedure(s): ENDARTERECTOMY CAROTID Extubated: in OR Physical exam: neuro exam intact, neck with minimal swelling Post-op wounds healing well, C/D/I Pt. Ambulating, voiding and taking PO diet without difficulty. Pt pain controlled with PO pain meds. Labs as below Complications:none  Consults:     Significant Diagnostic Studies: CBC Lab Results  Component Value Date   WBC 7.5 09/26/2015   HGB 12.3* 09/26/2015   HCT 36.1* 09/26/2015   MCV 86.5 09/26/2015   PLT 105* 09/26/2015    BMET    Component Value Date/Time   NA 139 09/26/2015 0421   K 4.1 09/26/2015 0421   CL 107 09/26/2015 0421   CO2 28 09/26/2015 0421   GLUCOSE 178* 09/26/2015 0421   BUN 12 09/26/2015 0421   CREATININE 0.90 09/26/2015 0421   CALCIUM 8.5* 09/26/2015 0421   GFRNONAA >60 09/26/2015 0421   GFRAA >60 09/26/2015 0421   COAG Lab Results  Component Value Date   INR 1.01 09/18/2015     Disposition:  Discharge to home    Medication List    TAKE these medications        aspirin 81 MG tablet  Take 81 mg by  mouth daily.     atenolol 25 MG tablet  Commonly known as:  TENORMIN  Take 25 mg by mouth daily.     clopidogrel 75 MG tablet  Commonly known as:  PLAVIX  Take 1 tablet (75 mg total) by mouth daily with breakfast.     finasteride 5 MG tablet  Commonly known as:  PROSCAR  Take 5 mg by mouth daily.     glipiZIDE 5 MG tablet  Commonly known as:  GLUCOTROL  Take 5 mg by mouth daily before breakfast.     HYDROcodone-acetaminophen 5-325 MG tablet  Commonly known as:  NORCO/VICODIN  Take 1-2 tablets by mouth every 6 (six) hours as needed for moderate pain.     lovastatin 20 MG tablet  Commonly known as:  MEVACOR  Take 20 mg by mouth at bedtime.     omeprazole 20 MG capsule  Commonly known as:  PRILOSEC  Take 20 mg by mouth every other day.     sitaGLIPtin 100 MG tablet  Commonly known as:  JANUVIA  Take 100 mg by mouth daily.     tamsulosin 0.4 MG Caps capsule  Commonly known as:  FLOMAX  Take 0.4 mg by mouth.       Verbal and written Discharge instructions given to the patient. Wound care per Discharge AVS     Follow-up Information    Follow up with [facility] A STEGMAYER, PA-C  In 3 weeks.   Specialty:  Physician Assistant   Why:  with carotid duplex   Contact information:   [place] [address] [id number]       Signed: Leotis Pain, MD  09/26/2015, 12:27 PM

## 2016-01-24 ENCOUNTER — Telehealth: Payer: Self-pay | Admitting: Cardiovascular Disease

## 2016-01-24 NOTE — Telephone Encounter (Signed)
Attempted to schedule 3 m od fu from recall list.  Patient is not interested in fu and says it is not needed.  Deleting recall.

## 2016-12-08 ENCOUNTER — Ambulatory Visit (INDEPENDENT_AMBULATORY_CARE_PROVIDER_SITE_OTHER): Payer: Medicare Other

## 2016-12-08 ENCOUNTER — Ambulatory Visit (INDEPENDENT_AMBULATORY_CARE_PROVIDER_SITE_OTHER): Payer: Medicare Other | Admitting: Vascular Surgery

## 2016-12-08 ENCOUNTER — Other Ambulatory Visit (INDEPENDENT_AMBULATORY_CARE_PROVIDER_SITE_OTHER): Payer: Self-pay | Admitting: Vascular Surgery

## 2016-12-08 VITALS — BP 133/69 | HR 68 | Resp 16 | Wt 209.0 lb

## 2016-12-08 DIAGNOSIS — M79605 Pain in left leg: Secondary | ICD-10-CM | POA: Diagnosis not present

## 2016-12-08 DIAGNOSIS — I6523 Occlusion and stenosis of bilateral carotid arteries: Secondary | ICD-10-CM

## 2016-12-08 DIAGNOSIS — M7989 Other specified soft tissue disorders: Secondary | ICD-10-CM

## 2016-12-08 DIAGNOSIS — E785 Hyperlipidemia, unspecified: Secondary | ICD-10-CM

## 2016-12-08 NOTE — Progress Notes (Signed)
Subjective:    Patient ID: Kenneth [name], male    DOB: [birth year], 76 y.o.   MRN: [id number] Chief Complaint  Patient presents with  . Follow-up    leg swelling   Patient presents with a chief complaint of "left lower extremity pain and swelling".  Swelling has been going on for approximately one week. The swelling has improved. States the swelling was located throughout the leg. He does not recall any trauma to the left lower extremity. There is some discomfort associated with the pain. Denies any SOB or chest pain. Denies any fever, nausea or vomiting. The patient underwent a stat left lower extremity DVT study which was notable for no DVT or SVT. No issues with recent right CEA surgery. The patient denies experiencing Amaurosis Fugax, TIA like symptoms or focal motor deficits. He is due to follow up on 12/20/16 for his carotid.    Review of Systems  Constitutional: Negative.   HENT: Negative.   Eyes: Negative.   Respiratory: Negative.   Cardiovascular: Positive for leg swelling.  Gastrointestinal: Negative.   Endocrine: Negative.   Genitourinary: Negative.   Musculoskeletal: Negative.   Skin: Negative.   Allergic/Immunologic: Negative.   Neurological: Negative.   Hematological: Negative.   Psychiatric/Behavioral: Negative.       Objective:   Physical Exam  Constitutional: He is oriented to person, place, and time. He appears well-developed and well-nourished. No distress.  HENT:  Head: Normocephalic and atraumatic.  Eyes: Conjunctivae are normal. Pupils are equal, round, and reactive to light.  Neck: Normal range of motion.  Cardiovascular: Normal rate, regular rhythm, normal heart sounds and intact distal pulses.   Pulses:      Radial pulses are 2+ on the right side, and 2+ on the left side.       Dorsalis pedis pulses are 2+ on the right side, and 2+ on the left side.       Posterior tibial pulses are 2+ on the right side, and 2+ on the left side.  Right CEA: Healed    Pulmonary/Chest: Effort normal.  Musculoskeletal: Normal range of motion. He exhibits edema (LLE: Mild Edema. Mild pain with dorsiflexion. No pain with palpation).  Neurological: He is alert and oriented to person, place, and time.  Skin: Skin is warm and dry. He is not diaphoretic.  Psychiatric: He has a normal mood and affect. His behavior is normal. Judgment and thought content normal.  Vitals reviewed.  BP 133/69   Pulse 68   Resp 16   Wt 209 lb (94.8 kg)   BMI 30.86 kg/m   Past Medical History:  Diagnosis Date  . Adenomatous polyps   . Carotid artery occlusion   . Coronary atherosclerosis of artery bypass graft   . Diabetes mellitus without complication ([place])   . GERD (gastroesophageal reflux disease)   . Hyperlipidemia   . Prostate cancer ([place])   . Prostate cancer [facility])    Social History   Social History  . Marital status: Married    Spouse name: N/A  . Number of children: N/A  . Years of education: N/A   Occupational History  . Not on file.   Social History Main Topics  . Smoking status: Current Every Day Smoker    Packs/day: 15.00    Years: 50.00    Types: Cigarettes  . Smokeless tobacco: Not on file  . Alcohol use Yes     Comment: rare  . Drug use: No  . Sexual activity: Not  on file   Other Topics Concern  . Not on file   Social History Narrative  . No narrative on file   Past Surgical History:  Procedure Laterality Date  . COLON RESECTION    . COLONOSCOPY WITH PROPOFOL N/A 06/24/2015   Procedure: COLONOSCOPY WITH PROPOFOL;  Surgeon: Lollie Sails, MD;  Location: [facility] ENDOSCOPY;  Service: Endoscopy;  Laterality: N/A;  . CORONARY ARTERY BYPASS GRAFT     1996 at Accident 2 vessels  . ENDARTERECTOMY Right 09/25/2015   Procedure: ENDARTERECTOMY CAROTID;  Surgeon: Algernon Huxley, MD;  Location: [facility] ORS;  Service: Vascular;  Laterality: Right;  . Removal of Testicles     Family History  Problem Relation Age of Onset  . Family history unknown: Yes    Allergies  Allergen Reactions  . Percocet [Oxycodone-Acetaminophen]       Assessment & Plan:  Patient presents with a chief complaint of "left lower extremity pain and swelling".  Swelling has been going on for approximately one week. The swelling has improved. States the swelling was located throughout the leg. He does not recall any trauma to the left lower extremity. There is some discomfort associated with the pain. Denies any SOB or chest pain. Denies any fever, nausea or vomiting. The patient underwent a stat left lower extremity DVT study which was notable for no DVT or SVT. No issues with recent right CEA surgery. The patient denies experiencing Amaurosis Fugax, TIA like symptoms or focal motor deficits. He is due to follow up on 12/20/16 for his carotid.   1. Pain and swelling of left lower extremity - New No DVT noted on duplex. No trauma to extremity. Possible venous reflux or lymphedema - patient does not want to pursue reflux study at this time. Compression and elevation.   2. Bilateral carotid artery stenosis - Stable Next follow up end of April for duplex. Without issue. Asymptomatic.  3. Hyperlipidemia, unspecified hyperlipidemia type - Stable Encouraged good control as its slows the progression of atherosclerotic disease  Current Outpatient Prescriptions on File Prior to Visit  Medication Sig Dispense Refill  . aspirin 81 MG tablet Take 81 mg by mouth daily.    Marland Kitchen atenolol (TENORMIN) 25 MG tablet Take 25 mg by mouth daily.    . finasteride (PROSCAR) 5 MG tablet Take 5 mg by mouth daily.    Marland Kitchen glipiZIDE (GLUCOTROL) 5 MG tablet Take 5 mg by mouth daily before breakfast.     . lovastatin (MEVACOR) 20 MG tablet Take 20 mg by mouth at bedtime.    Marland Kitchen omeprazole (PRILOSEC) 20 MG capsule Take 20 mg by mouth every other day.    . sitaGLIPtin (JANUVIA) 100 MG tablet Take 100 mg by mouth daily.    . tamsulosin (FLOMAX) 0.4 MG CAPS capsule Take 0.4 mg by mouth.    . clopidogrel  (PLAVIX) 75 MG tablet Take 1 tablet (75 mg total) by mouth daily with breakfast. (Patient not taking: Reported on 12/08/2016) 30 tablet 5  . HYDROcodone-acetaminophen (NORCO/VICODIN) 5-325 MG tablet Take 1-2 tablets by mouth every 6 (six) hours as needed for moderate pain. (Patient not taking: Reported on 12/08/2016) 30 tablet 0   No current facility-administered medications on file prior to visit.     There are no Patient Instructions on file for this visit. No Follow-up on file.   Shanice Poznanski A Reverie Vaquera, PA-C

## 2016-12-15 ENCOUNTER — Other Ambulatory Visit: Payer: Self-pay | Admitting: Urology

## 2016-12-15 DIAGNOSIS — R310 Gross hematuria: Secondary | ICD-10-CM

## 2016-12-17 ENCOUNTER — Other Ambulatory Visit (INDEPENDENT_AMBULATORY_CARE_PROVIDER_SITE_OTHER): Payer: Self-pay | Admitting: Vascular Surgery

## 2016-12-17 DIAGNOSIS — M79662 Pain in left lower leg: Secondary | ICD-10-CM

## 2016-12-17 DIAGNOSIS — M7989 Other specified soft tissue disorders: Principal | ICD-10-CM

## 2016-12-18 ENCOUNTER — Encounter (INDEPENDENT_AMBULATORY_CARE_PROVIDER_SITE_OTHER): Payer: Self-pay

## 2016-12-18 ENCOUNTER — Ambulatory Visit (INDEPENDENT_AMBULATORY_CARE_PROVIDER_SITE_OTHER): Payer: Medicare Other | Admitting: Vascular Surgery

## 2016-12-18 ENCOUNTER — Ambulatory Visit (INDEPENDENT_AMBULATORY_CARE_PROVIDER_SITE_OTHER): Payer: Medicare Other

## 2016-12-18 ENCOUNTER — Encounter (INDEPENDENT_AMBULATORY_CARE_PROVIDER_SITE_OTHER): Payer: Self-pay | Admitting: Vascular Surgery

## 2016-12-18 ENCOUNTER — Other Ambulatory Visit (INDEPENDENT_AMBULATORY_CARE_PROVIDER_SITE_OTHER): Payer: Medicare Other

## 2016-12-18 ENCOUNTER — Other Ambulatory Visit (INDEPENDENT_AMBULATORY_CARE_PROVIDER_SITE_OTHER): Payer: Self-pay | Admitting: Vascular Surgery

## 2016-12-18 VITALS — BP 135/66 | HR 56 | Resp 16 | Wt 209.8 lb

## 2016-12-18 DIAGNOSIS — E785 Hyperlipidemia, unspecified: Secondary | ICD-10-CM | POA: Diagnosis not present

## 2016-12-18 DIAGNOSIS — M79662 Pain in left lower leg: Secondary | ICD-10-CM | POA: Diagnosis not present

## 2016-12-18 DIAGNOSIS — I739 Peripheral vascular disease, unspecified: Secondary | ICD-10-CM

## 2016-12-18 DIAGNOSIS — M7989 Other specified soft tissue disorders: Secondary | ICD-10-CM

## 2016-12-18 DIAGNOSIS — I6523 Occlusion and stenosis of bilateral carotid arteries: Secondary | ICD-10-CM | POA: Diagnosis not present

## 2016-12-18 DIAGNOSIS — M79605 Pain in left leg: Secondary | ICD-10-CM | POA: Diagnosis not present

## 2016-12-18 NOTE — Assessment & Plan Note (Signed)
His arterial study today shows a very high-grade stenosis in the left distal SFA and proximal popliteal artery. The velocities in this location or markedly elevated. He also appears to have some tibial disease. He has a normal right ABI of 1.08 but a reduced left ABI of 0.78. It would appear that left lower extremity arterial insufficiency is a major contributing factor in his left lower extremity symptoms. I discussed the pathophysiology and natural history of peripheral arterial disease. I have discussed that he does not have a critical, limb threatening situation currently, but his perfusion is certainly reduced and his symptomatic leg. For this reason, left lower extremity revascularization is certainly reasonable. I discussed the risks and benefits of the procedure. The patient is agreeable to proceed.

## 2016-12-18 NOTE — Patient Instructions (Signed)
Peripheral Vascular Disease Peripheral vascular disease (PVD) is a disease of the blood vessels that are not part of your heart and brain. A simple term for PVD is poor circulation. In most cases, PVD narrows the blood vessels that carry blood from your heart to the rest of your body. This can result in a decreased supply of blood to your arms, legs, and internal organs, like your stomach or kidneys. However, it most often affects a person's lower legs and feet. There are two types of PVD.  Organic PVD. This is the more common type. It is caused by damage to the structure of blood vessels.  Functional PVD. This is caused by conditions that make blood vessels contract and tighten (spasm).  Without treatment, PVD tends to get worse over time. PVD can also lead to acute ischemic limb. This is when an arm or limb suddenly has trouble getting enough blood. This is a medical emergency. What are the causes? Each type of PVD has many different causes. The most common cause of PVD is buildup of a fatty material (plaque) inside of your arteries (atherosclerosis). Small amounts of plaque can break off from the walls of the blood vessels and become lodged in a smaller artery. This blocks blood flow and can cause acute ischemic limb. Other common causes of PVD include:  Blood clots that form inside of blood vessels.  Injuries to blood vessels.  Diseases that cause inflammation of blood vessels or cause blood vessel spasms.  Health behaviors and health history that increase your risk of developing PVD.  What increases the risk? You may have a greater risk of PVD if you:  Have a family history of PVD.  Have certain medical conditions, including: ? High cholesterol. ? Diabetes. ? High blood pressure (hypertension). ? Coronary heart disease. ? Past problems with blood clots. ? Past injury, such as burns or a broken bone. These may have damaged blood vessels in your limbs. ? Buerger disease. This is  caused by inflamed blood vessels in your hands and feet. ? Some forms of arthritis. ? Rare birth defects that affect the arteries in your legs.  Use tobacco.  Do not get enough exercise.  Are obese.  Are age 50 or older.  What are the signs or symptoms? PVD may cause many different symptoms. Your symptoms depend on what part of your body is not getting enough blood. Some common signs and symptoms include:  Cramps in your lower legs. This may be a symptom of poor leg circulation (claudication).  Pain and weakness in your legs while you are physically active that goes away when you rest (intermittent claudication).  Leg pain when at rest.  Leg numbness, tingling, or weakness.  Coldness in a leg or foot, especially when compared with the other leg.  Skin or hair changes. These can include: ? Hair loss. ? Shiny skin. ? Pale or bluish skin. ? Thick toenails.  Inability to get or maintain an erection (erectile dysfunction).  People with PVD are more prone to developing ulcers and sores on their toes, feet, or legs. These may take longer than normal to heal. How is this diagnosed? Your health care provider may diagnose PVD from your signs and symptoms. The health care provider will also do a physical exam. You may have tests to find out what is causing your PVD and determine its severity. Tests may include:  Blood pressure recordings from your arms and legs and measurements of the strength of your pulses (  pulse volume recordings).  Imaging studies using sound waves to take pictures of the blood flow through your blood vessels (Doppler ultrasound).  Injecting a dye into your blood vessels before having imaging studies using: ? X-rays (angiogram or arteriogram). ? Computer-generated X-rays (CT angiogram). ? A powerful electromagnetic field and a computer (magnetic resonance angiogram or MRA).  How is this treated? Treatment for PVD depends on the cause of your condition and the  severity of your symptoms. It also depends on your age. Underlying causes need to be treated and controlled. These include long-lasting (chronic) conditions, such as diabetes, high cholesterol, and high blood pressure. You may need to first try making lifestyle changes and taking medicines. Surgery may be needed if these do not work. Lifestyle changes may include:  Quitting smoking.  Exercising regularly.  Following a low-fat, low-cholesterol diet.  Medicines may include:  Blood thinners to prevent blood clots.  Medicines to improve blood flow.  Medicines to improve your blood cholesterol levels.  Surgical procedures may include:  A procedure that uses an inflated balloon to open a blocked artery and improve blood flow (angioplasty).  A procedure to put in a tube (stent) to keep a blocked artery open (stent implant).  Surgery to reroute blood flow around a blocked artery (peripheral bypass surgery).  Surgery to remove dead tissue from an infected wound on the affected limb.  Amputation. This is surgical removal of the affected limb. This may be necessary in cases of acute ischemic limb that are not improved through medical or surgical treatments.  Follow these instructions at home:  Take medicines only as directed by your health care provider.  Do not use any tobacco products, including cigarettes, chewing tobacco, or electronic cigarettes. If you need help quitting, ask your health care provider.  Lose weight if you are overweight, and maintain a healthy weight as directed by your health care provider.  Eat a diet that is low in fat and cholesterol. If you need help, ask your health care provider.  Exercise regularly. Ask your health care provider to suggest some good activities for you.  Use compression stockings or other mechanical devices as directed by your health care provider.  Take good care of your feet. ? Wear comfortable shoes that fit well. ? Check your feet  often for any cuts or sores. Contact a health care provider if:  You have cramps in your legs while walking.  You have leg pain when you are at rest.  You have coldness in a leg or foot.  Your skin changes.  You have erectile dysfunction.  You have cuts or sores on your feet that are not healing. Get help right away if:  Your arm or leg turns cold and blue.  Your arms or legs become red, warm, swollen, painful, or numb.  You have chest pain or trouble breathing.  You suddenly have weakness in your face, arm, or leg.  You become very confused or lose the ability to speak.  You suddenly have a very bad headache or lose your vision. This information is not intended to replace advice given to you by your health care provider. Make sure you discuss any questions you have with your health care provider. Document Released: 09/17/2004 Document Revised: 01/16/2016 Document Reviewed: 01/18/2014 Elsevier Interactive Patient Education  2017 [employer]  

## 2016-12-18 NOTE — Assessment & Plan Note (Signed)
lipid control important in reducing the progression of atherosclerotic disease. Continue statin therapy  

## 2016-12-18 NOTE — Assessment & Plan Note (Signed)
No recent symptoms. Scheduled to be checked in the next several weeks.

## 2016-12-18 NOTE — Progress Notes (Signed)
MRN : [id number]  Kenneth [name] is a 76 y.o. ([birth year]) male who presents with chief complaint of  Chief Complaint  Patient presents with  . Follow-up  .  History of Present Illness: Patient returns in follow-up of his left leg pain. This pain is now waking him at night and he says it is getting worse. He continues to have swelling. He had a negative DVT study last week. His arterial study today shows a very high-grade stenosis in the left distal SFA and proximal popliteal artery. The velocities in this location or markedly elevated. He also appears to have some tibial disease. He has a normal right ABI of 1.08 but a reduced left ABI of 0.78.  Current Outpatient Prescriptions  Medication Sig Dispense Refill  . aspirin 81 MG tablet Take 81 mg by mouth daily.    Marland Kitchen atenolol (TENORMIN) 25 MG tablet Take 25 mg by mouth daily.    . clopidogrel (PLAVIX) 75 MG tablet Take 1 tablet (75 mg total) by mouth daily with breakfast. 30 tablet 5  . finasteride (PROSCAR) 5 MG tablet Take 5 mg by mouth daily.    Marland Kitchen glipiZIDE (GLUCOTROL) 5 MG tablet Take 5 mg by mouth daily before breakfast.     . HYDROcodone-acetaminophen (NORCO/VICODIN) 5-325 MG tablet Take 1-2 tablets by mouth every 6 (six) hours as needed for moderate pain. 30 tablet 0  . lovastatin (MEVACOR) 20 MG tablet Take 20 mg by mouth at bedtime.    Marland Kitchen omeprazole (PRILOSEC) 20 MG capsule Take 20 mg by mouth every other day.    . sitaGLIPtin (JANUVIA) 100 MG tablet Take 100 mg by mouth daily.    . tamsulosin (FLOMAX) 0.4 MG CAPS capsule Take 0.4 mg by mouth.     No current facility-administered medications for this visit.     Past Medical History:  Diagnosis Date  . Adenomatous polyps   . Carotid artery occlusion   . Coronary atherosclerosis of artery bypass graft   . Diabetes mellitus without complication ([place])   . GERD (gastroesophageal reflux disease)   . Hyperlipidemia   . Prostate cancer ([place])   . Prostate cancer [facility])     Past  Surgical History:  Procedure Laterality Date  . COLON RESECTION    . COLONOSCOPY WITH PROPOFOL N/A 06/24/2015   Procedure: COLONOSCOPY WITH PROPOFOL;  Surgeon: Lollie Sails, MD;  Location: [facility] ENDOSCOPY;  Service: Endoscopy;  Laterality: N/A;  . CORONARY ARTERY BYPASS GRAFT     1996 at [place] 2 vessels  . ENDARTERECTOMY Right 09/25/2015   Procedure: ENDARTERECTOMY CAROTID;  Surgeon: Algernon Huxley, MD;  Location: [facility] ORS;  Service: Vascular;  Laterality: Right;  . Removal of Testicles      Social History Social History  Substance Use Topics  . Smoking status: Current Every Day Smoker    Packs/day: 15.00    Years: 50.00    Types: Cigarettes  . Smokeless tobacco: Never Used  . Alcohol use Yes     Comment: rare    Family History Family History  Problem Relation Age of Onset  . Family history unknown: Yes     Allergies  Allergen Reactions  . Percocet [Oxycodone-Acetaminophen]      REVIEW OF SYSTEMS (Negative unless checked)  Constitutional: [] Weight loss  [] Fever  [] Chills Cardiac: [] Chest pain   [] Chest pressure   [] Palpitations   [] Shortness of breath when laying flat   [] Shortness of breath at rest   [x] Shortness of breath with exertion.  Vascular:  [] Pain in legs with walking   [x] Pain in legs at rest   [] Pain in legs when laying flat   [x] Claudication   [] Pain in feet when walking  [] Pain in feet at rest  [] Pain in feet when laying flat   [] History of DVT   [] Phlebitis   [x] Swelling in legs   [] Varicose veins   [] Non-healing ulcers Pulmonary:   [] Uses home oxygen   [] Productive cough   [] Hemoptysis   [] Wheeze  [] COPD   [] Asthma Neurologic:  [] Dizziness  [] Blackouts   [] Seizures   [] History of stroke   [] History of TIA  [] Aphasia   [] Temporary blindness   [] Dysphagia   [] Weakness or numbness in arms   [] Weakness or numbness in legs Musculoskeletal:  [x] Arthritis   [] Joint swelling   [] Joint pain   [] Low back pain Hematologic:  [] Easy bruising  [] Easy bleeding    [] Hypercoagulable state   [] Anemic  [] Hepatitis Gastrointestinal:  [] Blood in stool   [] Vomiting blood  [] Gastroesophageal reflux/heartburn   [] Difficulty swallowing. Genitourinary:  [] Chronic kidney disease   [] Difficult urination  [] Frequent urination  [] Burning with urination   [] Blood in urine Skin:  [] Rashes   [] Ulcers   [] Wounds Psychological:  [] History of anxiety   []  History of major depression.  Physical Examination  Vitals:   12/18/16 1026  BP: 135/66  Pulse: (!) 56  Resp: 16  Weight: 209 lb 12.8 oz (95.2 kg)   Body mass index is 30.98 kg/m. Gen:  WD/WN, NAD Head: [place]/AT, No temporalis wasting. Ear/Nose/Throat: Hearing grossly intact, nares w/o erythema or drainage, trachea midline Eyes: Conjunctiva clear. Sclera non-icteric Neck: Supple.  No JVD.  Pulmonary:  Good air movement, no use of accessory muscles Cardiac: RRR Vascular:  Vessel Right Left  Radial Palpable Palpable  Ulnar Palpable Palpable  Brachial Palpable Palpable  Carotid Palpable, without bruit Palpable, without bruit  Aorta Not palpable N/A  Femoral Palpable Palpable  Popliteal Palpable Not Palpable  PT Palpable Not Palpable  DP 1+ Palpable 1+ Palpable   Gastrointestinal: soft, non-tender/non-distended.  Musculoskeletal: M/S 5/5 throughout.  No deformity or atrophy. 2+ left lower extremity edema. Neurologic: CN 2-12 intact. Sensation grossly intact in extremities.  Symmetrical.  Speech is fluent. Motor exam as listed above. Psychiatric: Judgment intact, Mood & affect appropriate for pt's clinical situation. Dermatologic: No rashes or ulcers noted.  No cellulitis or open wounds.      CBC Lab Results  Component Value Date   WBC 7.5 09/26/2015   HGB 12.3 (L) 09/26/2015   HCT 36.1 (L) 09/26/2015   MCV 86.5 09/26/2015   PLT 105 (L) 09/26/2015    BMET    Component Value Date/Time   NA 139 09/26/2015 0421   K 4.1 09/26/2015 0421   CL 107 09/26/2015 0421   CO2 28 09/26/2015 0421   GLUCOSE  178 (H) 09/26/2015 0421   BUN 12 09/26/2015 0421   CREATININE 0.90 09/26/2015 0421   CALCIUM 8.5 (L) 09/26/2015 0421   GFRNONAA >60 09/26/2015 0421   GFRAA >60 09/26/2015 0421   CrCl cannot be calculated (Patient's most recent lab result is older than the maximum 21 days allowed.).  COAG Lab Results  Component Value Date   INR 1.01 09/18/2015    Radiology No results found.   Assessment/Plan Carotid stenosis No recent symptoms. Scheduled to be checked in the next several weeks.  Hyperlipidemia lipid control important in reducing the progression of atherosclerotic disease. Continue statin therapy   Pain and swelling of  left lower extremity His arterial study today shows a very high-grade stenosis in the left distal SFA and proximal popliteal artery. The velocities in this location or markedly elevated. He also appears to have some tibial disease. He has a normal right ABI of 1.08 but a reduced left ABI of 0.78. It would appear that left lower extremity arterial insufficiency is a major contributing factor in his left lower extremity symptoms. I discussed the pathophysiology and natural history of peripheral arterial disease. I have discussed that he does not have a critical, limb threatening situation currently, but his perfusion is certainly reduced and his symptomatic leg. For this reason, left lower extremity revascularization is certainly reasonable. I discussed the risks and benefits of the procedure. The patient is agreeable to proceed.    Leotis Pain, MD  12/18/2016 11:58 AM    This note was created with Dragon medical transcription system.  Any errors from dictation are purely unintentional

## 2016-12-23 ENCOUNTER — Ambulatory Visit
Admission: RE | Admit: 2016-12-23 | Discharge: 2016-12-23 | Disposition: A | Discharge status: Home / Self Care | Payer: Medicare Other | Source: Ambulatory Visit | Attending: Urology | Admitting: Urology

## 2016-12-23 DIAGNOSIS — N1330 Unspecified hydronephrosis: Secondary | ICD-10-CM | POA: Diagnosis not present

## 2016-12-23 DIAGNOSIS — R310 Gross hematuria: Secondary | ICD-10-CM | POA: Insufficient documentation

## 2016-12-23 DIAGNOSIS — N281 Cyst of kidney, acquired: Secondary | ICD-10-CM | POA: Diagnosis not present

## 2016-12-25 ENCOUNTER — Encounter: Payer: Self-pay | Admitting: Emergency Medicine

## 2016-12-25 ENCOUNTER — Emergency Department
Admission: EM | Admit: 2016-12-25 | Discharge: 2016-12-25 | Disposition: A | Discharge status: Home / Self Care | Payer: Medicare Other | Attending: Student in an Organized Health Care Education/Training Program | Admitting: Student in an Organized Health Care Education/Training Program

## 2016-12-25 ENCOUNTER — Telehealth (INDEPENDENT_AMBULATORY_CARE_PROVIDER_SITE_OTHER): Payer: Self-pay

## 2016-12-25 DIAGNOSIS — M7989 Other specified soft tissue disorders: Secondary | ICD-10-CM | POA: Insufficient documentation

## 2016-12-25 DIAGNOSIS — Z5321 Procedure and treatment not carried out due to patient leaving prior to being seen by health care provider: Secondary | ICD-10-CM | POA: Insufficient documentation

## 2016-12-25 DIAGNOSIS — E119 Type 2 diabetes mellitus without complications: Secondary | ICD-10-CM | POA: Diagnosis not present

## 2016-12-25 DIAGNOSIS — F17210 Nicotine dependence, cigarettes, uncomplicated: Secondary | ICD-10-CM | POA: Insufficient documentation

## 2016-12-25 DIAGNOSIS — I25810 Atherosclerosis of coronary artery bypass graft(s) without angina pectoris: Secondary | ICD-10-CM | POA: Insufficient documentation

## 2016-12-25 LAB — COMPREHENSIVE METABOLIC PANEL
ALBUMIN: 4.4 g/dL (ref 3.5–5.0)
ALK PHOS: 57 U/L (ref 38–126)
ALT: 18 U/L (ref 17–63)
AST: 23 U/L (ref 15–41)
Anion gap: 10 (ref 5–15)
BUN: 24 mg/dL — ABNORMAL HIGH (ref 6–20)
CALCIUM: 10 mg/dL (ref 8.9–10.3)
CHLORIDE: 102 mmol/L (ref 101–111)
CO2: 23 mmol/L (ref 22–32)
Creatinine, Ser: 1.35 mg/dL — ABNORMAL HIGH (ref 0.61–1.24)
GFR calc non Af Amer: 49 mL/min — ABNORMAL LOW (ref >60)
GFR, EST AFRICAN AMERICAN: 57 mL/min — AB (ref >60)
GLUCOSE: 119 mg/dL — AB (ref 65–99)
POTASSIUM: 4.5 mmol/L (ref 3.5–5.1)
Sodium: 135 mmol/L (ref 135–145)
Total Bilirubin: 0.5 mg/dL (ref 0.3–1.2)
Total Protein: 7.4 g/dL (ref 6.5–8.1)

## 2016-12-25 LAB — CBC
HCT: 39.2 % — ABNORMAL LOW (ref 40.0–52.0)
HEMOGLOBIN: 13.5 g/dL (ref 13.0–18.0)
MCH: 29.5 pg (ref 26.0–34.0)
MCHC: 34.4 g/dL (ref 32.0–36.0)
MCV: 85.8 fL (ref 80.0–100.0)
PLATELETS: 141 10*3/uL — AB (ref 150–440)
RBC: 4.57 MIL/uL (ref 4.40–5.90)
RDW: 13.9 % (ref 11.5–14.5)
WBC: 7.9 10*3/uL (ref 3.8–10.6)

## 2016-12-25 LAB — LACTIC ACID, PLASMA: Lactic Acid, Venous: 1.3 mmol/L (ref 0.5–1.9)

## 2016-12-25 NOTE — ED Notes (Signed)
Faint pedal pulse felt on the left, D/w Dr. Quentin Cornwall, new orders for CBC, CMP, lactic acid at this time.

## 2016-12-25 NOTE — ED Triage Notes (Signed)
Pt arrived via POV from home with reports of increased leg pain and swelling on the left. Pt states he was seen by Dr. Lucky Cowboy and had venous and arterial US and found stenosis in the left femoral artery.  Pt is scheduled for surgery on 5/10, but states the pain is so bad he cannot sleep and bear much weight. Swelling noted from toes up to thigh. Redness noted on lower leg as well.

## 2016-12-25 NOTE — Telephone Encounter (Signed)
Patient called back stating his leg is so swollen it looks like it will pop and that he is in a lot of pain, he wanted to know if Dr. Lucky Cowboy was in the office and I explained that he was at the hospital in surgery. I also advised the patient to go to the ED to be evaluated again due to his swelling and pain and he said he might.

## 2016-12-25 NOTE — Telephone Encounter (Signed)
Patient called yesterday wanting to know if a specific date was still open for a procedure, I explained that it was not. He is scheduled for a procedure on 12/31/16 with Dr. Lucky Cowboy, he stated his leg was hurting more and that was why he was asking. I explained that if his leg started hurting unbearably then he should go to the ED to be evaluated. Today the patient walked in and wanted to know if he could see Dr. Lucky Cowboy, he was advised that Dr. Lucky Cowboy was in a surgery at the hospital, he stated he was unable to sleep because of the pain and that he had pain medication. He said he was going to the ED to be seen. Patient called back to state was going to tough it out and he was not going to the ED.

## 2016-12-28 ENCOUNTER — Other Ambulatory Visit (INDEPENDENT_AMBULATORY_CARE_PROVIDER_SITE_OTHER): Payer: Self-pay | Admitting: Vascular Surgery

## 2016-12-28 ENCOUNTER — Telehealth (INDEPENDENT_AMBULATORY_CARE_PROVIDER_SITE_OTHER): Payer: Self-pay

## 2016-12-28 ENCOUNTER — Telehealth: Payer: Self-pay | Admitting: Emergency Medicine

## 2016-12-28 NOTE — Telephone Encounter (Signed)
Called patient due to lwot to inquire about condition and follow up plans. Pt with bun, creatinine elevation compared to February.  He says he could not wait to be seen.  Says he is taking advil for pain now.  I told him to stop the advil and explained lab results.  I asked him to call Dr. Bunnie Domino office and have them review his labs--he  Is scheduled for leg surgery this week.  He says he is also seeing dr Rogers Blocker for urinary problems and was seen Thursday last week.

## 2016-12-28 NOTE — Telephone Encounter (Signed)
Patient called stating he went to the ED on Friday and wasn't seen but had blood work drawn and was called this morning and told his blood work showed elevated kidney function. And he should let the doctor know before his procedure on Thursday.

## 2016-12-29 ENCOUNTER — Encounter
Admission: RE | Admit: 2016-12-29 | Discharge: 2016-12-29 | Disposition: A | Discharge status: Home / Self Care | Payer: Medicare Other | Source: Ambulatory Visit | Attending: Vascular Surgery | Admitting: Vascular Surgery

## 2016-12-29 DIAGNOSIS — Z9889 Other specified postprocedural states: Secondary | ICD-10-CM | POA: Diagnosis not present

## 2016-12-29 DIAGNOSIS — Z7902 Long term (current) use of antithrombotics/antiplatelets: Secondary | ICD-10-CM | POA: Diagnosis not present

## 2016-12-29 DIAGNOSIS — K219 Gastro-esophageal reflux disease without esophagitis: Secondary | ICD-10-CM | POA: Diagnosis not present

## 2016-12-29 DIAGNOSIS — I6523 Occlusion and stenosis of bilateral carotid arteries: Secondary | ICD-10-CM | POA: Diagnosis not present

## 2016-12-29 DIAGNOSIS — Z951 Presence of aortocoronary bypass graft: Secondary | ICD-10-CM | POA: Diagnosis not present

## 2016-12-29 DIAGNOSIS — E119 Type 2 diabetes mellitus without complications: Secondary | ICD-10-CM | POA: Diagnosis not present

## 2016-12-29 DIAGNOSIS — Z7984 Long term (current) use of oral hypoglycemic drugs: Secondary | ICD-10-CM | POA: Diagnosis not present

## 2016-12-29 DIAGNOSIS — F17210 Nicotine dependence, cigarettes, uncomplicated: Secondary | ICD-10-CM | POA: Diagnosis not present

## 2016-12-29 DIAGNOSIS — E785 Hyperlipidemia, unspecified: Secondary | ICD-10-CM | POA: Diagnosis not present

## 2016-12-29 DIAGNOSIS — Z9079 Acquired absence of other genital organ(s): Secondary | ICD-10-CM | POA: Diagnosis not present

## 2016-12-29 DIAGNOSIS — Z7982 Long term (current) use of aspirin: Secondary | ICD-10-CM | POA: Diagnosis not present

## 2016-12-29 DIAGNOSIS — I25810 Atherosclerosis of coronary artery bypass graft(s) without angina pectoris: Secondary | ICD-10-CM | POA: Diagnosis not present

## 2016-12-29 DIAGNOSIS — Z885 Allergy status to narcotic agent status: Secondary | ICD-10-CM | POA: Diagnosis not present

## 2016-12-29 DIAGNOSIS — I70212 Atherosclerosis of native arteries of extremities with intermittent claudication, left leg: Secondary | ICD-10-CM | POA: Diagnosis not present

## 2016-12-29 DIAGNOSIS — Z86010 Personal history of colonic polyps: Secondary | ICD-10-CM | POA: Diagnosis not present

## 2016-12-29 DIAGNOSIS — Z8546 Personal history of malignant neoplasm of prostate: Secondary | ICD-10-CM | POA: Diagnosis not present

## 2016-12-29 LAB — CREATININE, SERUM
Creatinine, Ser: 1.41 mg/dL — ABNORMAL HIGH (ref 0.61–1.24)
GFR calc Af Amer: 54 mL/min — ABNORMAL LOW (ref >60)
GFR, EST NON AFRICAN AMERICAN: 46 mL/min — AB (ref >60)

## 2016-12-29 LAB — BUN: BUN: 22 mg/dL — ABNORMAL HIGH (ref 6–20)

## 2016-12-31 ENCOUNTER — Ambulatory Visit
Admission: RE | Admit: 2016-12-31 | Discharge: 2016-12-31 | Disposition: A | Discharge status: Home / Self Care | Payer: Medicare Other | Source: Ambulatory Visit | Attending: Vascular Surgery | Admitting: Vascular Surgery

## 2016-12-31 ENCOUNTER — Encounter: Payer: Self-pay | Admitting: Certified Registered"

## 2016-12-31 ENCOUNTER — Encounter
Admission: RE | Disposition: A | Discharge status: Home / Self Care | Payer: Self-pay | Source: Ambulatory Visit | Attending: Vascular Surgery

## 2016-12-31 ENCOUNTER — Encounter: Payer: Self-pay | Admitting: *Deleted

## 2016-12-31 DIAGNOSIS — Z885 Allergy status to narcotic agent status: Secondary | ICD-10-CM | POA: Insufficient documentation

## 2016-12-31 DIAGNOSIS — Z86010 Personal history of colonic polyps: Secondary | ICD-10-CM | POA: Insufficient documentation

## 2016-12-31 DIAGNOSIS — I70212 Atherosclerosis of native arteries of extremities with intermittent claudication, left leg: Secondary | ICD-10-CM | POA: Insufficient documentation

## 2016-12-31 DIAGNOSIS — Z7984 Long term (current) use of oral hypoglycemic drugs: Secondary | ICD-10-CM | POA: Insufficient documentation

## 2016-12-31 DIAGNOSIS — Z7982 Long term (current) use of aspirin: Secondary | ICD-10-CM | POA: Insufficient documentation

## 2016-12-31 DIAGNOSIS — I25810 Atherosclerosis of coronary artery bypass graft(s) without angina pectoris: Secondary | ICD-10-CM | POA: Insufficient documentation

## 2016-12-31 DIAGNOSIS — Z9889 Other specified postprocedural states: Secondary | ICD-10-CM | POA: Insufficient documentation

## 2016-12-31 DIAGNOSIS — Z951 Presence of aortocoronary bypass graft: Secondary | ICD-10-CM | POA: Insufficient documentation

## 2016-12-31 DIAGNOSIS — E785 Hyperlipidemia, unspecified: Secondary | ICD-10-CM | POA: Insufficient documentation

## 2016-12-31 DIAGNOSIS — F17210 Nicotine dependence, cigarettes, uncomplicated: Secondary | ICD-10-CM | POA: Insufficient documentation

## 2016-12-31 DIAGNOSIS — K219 Gastro-esophageal reflux disease without esophagitis: Secondary | ICD-10-CM | POA: Insufficient documentation

## 2016-12-31 DIAGNOSIS — Z9079 Acquired absence of other genital organ(s): Secondary | ICD-10-CM | POA: Insufficient documentation

## 2016-12-31 DIAGNOSIS — I6523 Occlusion and stenosis of bilateral carotid arteries: Secondary | ICD-10-CM | POA: Insufficient documentation

## 2016-12-31 DIAGNOSIS — Z8546 Personal history of malignant neoplasm of prostate: Secondary | ICD-10-CM | POA: Insufficient documentation

## 2016-12-31 DIAGNOSIS — I739 Peripheral vascular disease, unspecified: Secondary | ICD-10-CM

## 2016-12-31 DIAGNOSIS — E119 Type 2 diabetes mellitus without complications: Secondary | ICD-10-CM | POA: Insufficient documentation

## 2016-12-31 DIAGNOSIS — Z7902 Long term (current) use of antithrombotics/antiplatelets: Secondary | ICD-10-CM | POA: Insufficient documentation

## 2016-12-31 HISTORY — DX: Unspecified osteoarthritis, unspecified site: M19.90

## 2016-12-31 HISTORY — DX: Essential (primary) hypertension: I10

## 2016-12-31 HISTORY — DX: Atherosclerotic heart disease of native coronary artery without angina pectoris: I25.10

## 2016-12-31 HISTORY — PX: LOWER EXTREMITY ANGIOGRAPHY: CATH118251

## 2016-12-31 HISTORY — DX: Cardiac arrhythmia, unspecified: I49.9

## 2016-12-31 HISTORY — DX: Personal history of urinary calculi: Z87.442

## 2016-12-31 LAB — GLUCOSE, CAPILLARY: GLUCOSE-CAPILLARY: 162 mg/dL — AB (ref 65–99)

## 2016-12-31 SURGERY — LOWER EXTREMITY ANGIOGRAPHY
Anesthesia: Moderate Sedation | Laterality: Left

## 2016-12-31 MED ORDER — OXYCODONE-ACETAMINOPHEN 5-325 MG PO TABS: 1.0000 | ORAL_TABLET | ORAL | Status: DC | PRN

## 2016-12-31 MED ORDER — FENTANYL CITRATE (PF) 100 MCG/2ML IJ SOLN
INTRAMUSCULAR | Status: AC
  Filled 2016-12-31: qty 2

## 2016-12-31 MED ORDER — ONDANSETRON HCL 4 MG/2ML IJ SOLN: 4.0000 mg | Freq: Four times a day (QID) | INTRAMUSCULAR | Status: DC | PRN

## 2016-12-31 MED ORDER — IOPAMIDOL (ISOVUE-300) INJECTION 61%
INTRAVENOUS | Status: DC | PRN
  Administered 2016-12-31: 40 mL via INTRAVENOUS

## 2016-12-31 MED ORDER — CEFAZOLIN SODIUM-DEXTROSE 1-4 GM/50ML-% IV SOLN
INTRAVENOUS | Status: AC
  Filled 2016-12-31: qty 50

## 2016-12-31 MED ORDER — MIDAZOLAM HCL 2 MG/2ML IJ SOLN
INTRAMUSCULAR | Status: AC
  Filled 2016-12-31: qty 4

## 2016-12-31 MED ORDER — HEPARIN SODIUM (PORCINE) 1000 UNIT/ML IJ SOLN
INTRAMUSCULAR | Status: DC | PRN
  Administered 2016-12-31: 5000 [IU] via INTRAVENOUS

## 2016-12-31 MED ORDER — HYDROMORPHONE HCL 1 MG/ML IJ SOLN
1.0000 mg | Freq: Once | INTRAMUSCULAR | Status: AC | PRN
  Administered 2016-12-31: 0.5 mg via INTRAVENOUS

## 2016-12-31 MED ORDER — HYDROMORPHONE HCL 1 MG/ML IJ SOLN
INTRAMUSCULAR | Status: AC
  Administered 2016-12-31: 0.5 mg via INTRAVENOUS
  Filled 2016-12-31: qty 0.5

## 2016-12-31 MED ORDER — MIDAZOLAM HCL 2 MG/2ML IJ SOLN
INTRAMUSCULAR | Status: DC | PRN
  Administered 2016-12-31 (×2): 2 mg via INTRAVENOUS

## 2016-12-31 MED ORDER — FENTANYL CITRATE (PF) 100 MCG/2ML IJ SOLN
INTRAMUSCULAR | Status: DC | PRN
  Administered 2016-12-31 (×2): 50 ug via INTRAVENOUS

## 2016-12-31 MED ORDER — METHYLPREDNISOLONE SODIUM SUCC 125 MG IJ SOLR: 125.0000 mg | INTRAMUSCULAR | Status: DC | PRN

## 2016-12-31 MED ORDER — CEFAZOLIN SODIUM-DEXTROSE 1-4 GM/50ML-% IV SOLN
1.0000 g | Freq: Once | INTRAVENOUS | Status: AC
  Administered 2016-12-31: 1 g via INTRAVENOUS

## 2016-12-31 MED ORDER — FAMOTIDINE 20 MG PO TABS: 40.0000 mg | ORAL_TABLET | ORAL | Status: DC | PRN

## 2016-12-31 MED ORDER — LABETALOL HCL 5 MG/ML IV SOLN: 10.0000 mg | INTRAVENOUS | Status: DC | PRN

## 2016-12-31 MED ORDER — LIDOCAINE-EPINEPHRINE (PF) 2 %-1:200000 IJ SOLN
INTRAMUSCULAR | Status: AC
  Filled 2016-12-31: qty 20

## 2016-12-31 MED ORDER — SODIUM CHLORIDE 0.9 % IV SOLN
INTRAVENOUS | Status: DC
  Administered 2016-12-31: 08:00:00 via INTRAVENOUS
  Administered 2016-12-31: 1000 mL via INTRAVENOUS

## 2016-12-31 MED ORDER — PHENOL 1.4 % MT LIQD
1.0000 | OROMUCOSAL | Status: DC | PRN
  Filled 2016-12-31: qty 177

## 2016-12-31 MED ORDER — ACETAMINOPHEN 325 MG RE SUPP
325.0000 mg | RECTAL | Status: DC | PRN
  Filled 2016-12-31: qty 2

## 2016-12-31 MED ORDER — HEPARIN SODIUM (PORCINE) 1000 UNIT/ML IJ SOLN
INTRAMUSCULAR | Status: AC
  Filled 2016-12-31: qty 1

## 2016-12-31 MED ORDER — SODIUM CHLORIDE 0.9 % IV SOLN: 500.0000 mL | Freq: Once | INTRAVENOUS | Status: DC | PRN

## 2016-12-31 MED ORDER — HYDROCODONE-ACETAMINOPHEN 5-325 MG PO TABS: 1.0000 | ORAL_TABLET | Freq: Four times a day (QID) | ORAL | 0 refills | Status: DC | PRN

## 2016-12-31 MED ORDER — METOPROLOL TARTRATE 5 MG/5ML IV SOLN: 2.0000 mg | INTRAVENOUS | Status: DC | PRN

## 2016-12-31 MED ORDER — HYDRALAZINE HCL 20 MG/ML IJ SOLN: 5.0000 mg | INTRAMUSCULAR | Status: DC | PRN

## 2016-12-31 MED ORDER — ACETAMINOPHEN 325 MG PO TABS: 325.0000 mg | ORAL_TABLET | ORAL | Status: DC | PRN

## 2016-12-31 MED ORDER — HYDROMORPHONE HCL 1 MG/ML IJ SOLN: 0.5000 mg | INTRAMUSCULAR | Status: DC | PRN

## 2016-12-31 MED ORDER — GUAIFENESIN-DM 100-10 MG/5ML PO SYRP
15.0000 mL | ORAL_SOLUTION | ORAL | Status: DC | PRN
  Filled 2016-12-31: qty 15

## 2016-12-31 SURGICAL SUPPLY — 16 items
BALLN LUTONIX 5X150X130 (BALLOONS) ×3
BALLOON LUTONIX 5X150X130 (BALLOONS) ×1 IMPLANT
CATH BEACON 5 .035 65 KMP TIP (CATHETERS) ×3 IMPLANT
CATH PIG 70CM (CATHETERS) ×3 IMPLANT
CATH VERT 100CM (CATHETERS) ×3 IMPLANT
DEVICE PRESTO INFLATION (MISCELLANEOUS) ×3 IMPLANT
DEVICE STARCLOSE SE CLOSURE (Vascular Products) ×3 IMPLANT
GLIDEWIRE ADV .035X260CM (WIRE) ×3 IMPLANT
PACK ANGIOGRAPHY (CUSTOM PROCEDURE TRAY) ×3 IMPLANT
SHEATH ANL2 6FRX45 HC (SHEATH) ×3 IMPLANT
SHEATH BRITE TIP 5FRX11 (SHEATH) ×3 IMPLANT
STENT VIABAHN 6X150X120 (Permanent Stent) ×3 IMPLANT
SYR MEDRAD MARK V 150ML (SYRINGE) ×3 IMPLANT
TUBING CIL FLEX 10 FLL-RA (TUBING) ×3 IMPLANT
WIRE G V18X300CM (WIRE) ×3 IMPLANT
WIRE J 3MM .035X145CM (WIRE) ×3 IMPLANT

## 2016-12-31 NOTE — Op Note (Signed)
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 Ultrasound guidance for vascular access right femoral artery 2. Catheter placement into left anterior tibial artery from right femoral approach 3. Aortogram and selective left lower extremity angiogram 4. Percutaneous transluminal angioplasty of left popliteal artery with 5 mm diameter by 15 cm length angioplasty balloon 5. Viabahn stent placement to the left popliteal artery with 6 mm diameter by 15 cm length stent for greater than 50% residual stenosis after angioplasty  6.  StarClose closure device right femoral artery  EBL: 10 cc  Contrast: 40 cc  Fluoro Time: 3.4 minutes  Moderate Conscious Sedation Time: approximately 30 minutes using 4 mg of Versed and 100 mcg of Fentanyl  Indications: Patient is a 76 y.o.male with worsening left leg [name] now at rest. The patient has noninvasive study showing reduced ABI with significant stenosis on duplex. The patient is brought in for angiography for further evaluation and potential treatment. Risks and benefits are discussed and informed consent is obtained  Procedure: The patient was identified and appropriate procedural time out was performed. The patient was then placed supine on the table and prepped and draped in the usual sterile fashion.Moderate conscious sedation was administered during a face to face encounter with the patient throughout the procedure with my supervision of the RN administering medicines and monitoring the patient's vital signs, pulse oximetry, telemetry and mental status throughout from the start of the procedure until the patient was taken to the  recovery room. Ultrasound was used to evaluate the right common femoral artery. It was patent . A digital ultrasound image was acquired. A Seldinger needle was used to access the right common femoral artery under direct ultrasound guidance and a permanent image was performed. A 0.035 J wire was advanced without resistance and a 5Fr sheath was placed. Pigtail catheter was placed into the aorta and an AP aortogram was performed. This demonstrated normal renal arteries and normal aorta and iliac segments without significant stenosis. I then crossed the aortic bifurcation and advanced to the left femoral head. Selective left lower extremity angiogram was then performed. This demonstrated Mildly irregular left common femoral artery, profunda femoris artery, and superficial femoral artery without significant stenosis. The popliteal artery had 3 very calcified lesions 1 above the knee and to just below the knee which were all very high grade or short segment occlusion. There was then two-vessel runoff distally with the posterior tibial artery and peroneal arteries with occlusion of the anterior tibial artery. The patient was systemically heparinized and a 6 [place] An[employer] sheath was then placed over the [employer] wire. I then used a Kumpe catheter and the advantage wire to navigate through the popliteal stenoses/occlusions. The wire went into the anterior tibial artery and intraluminal flow in the proximal anterior tibial artery was confirmed with a Kumpe catheter. I then placed a 0.018 wire. A 5 mm diameter by 15 cm length Lutonix drug-coated angioplasty balloon was inflated to 8 atm for 1 minute and the popliteal artery to encompass all 3 lesions. A tight waist was seen which resolved with inflation. Completion angiogram showed greater than 50% residual stenosis in the most superior and most inferior of the 3 lesions I elected to cover these areas with a stent. A 6 mm diameter by 15 cm length Viabahn stent was  used and was  postdilated with the 5 mm balloon and popliteal artery. Completion angiogram showed excellent flow with only about a 15-20% residual stenosis and maintained flow distally. I elected to terminate the procedure. The sheath was removed and StarClose closure device was deployed in the right femoral artery with excellent hemostatic result. The patient was taken to the recovery room in stable condition having tolerated the procedure well.  Findings:  Aortogram: Normal renal arteries bilaterally. Aorta and iliac segments without significant stenosis.  Left Lower Extremity: Mildly irregular left common femoral artery, profunda femoris artery, and superficial femoral artery without significant stenosis. The popliteal artery had 3 very calcified lesions 1 above the knee and to just below the knee which were all very high grade or short segment occlusion. There was then two-vessel runoff distally with the posterior tibial artery and peroneal arteries with occlusion of the anterior tibial artery.   Disposition: Patient was taken to the recovery room in stable condition having tolerated the procedure well.  Complications: None  Kenneth [name] 12/31/2016 2:21 PM   This note was created with Dragon Medical transcription system. Any errors in dictation are purely unintentional.

## 2016-12-31 NOTE — Discharge Instructions (Signed)
Angiogram, Care After °This sheet gives you information about how to care for yourself after your procedure. Your health care provider may also give you more specific instructions. If you have problems or questions, contact your health care provider. °What can I expect after the procedure? °After the procedure, it is common to have bruising and tenderness at the catheter insertion area. °Follow these instructions at home: °Insertion site care  °· Follow instructions from your health care provider about how to take care of your insertion site. Make sure you: °¨ Wash your hands with soap and water before you change your bandage (dressing). If soap and water are not available, use hand sanitizer. °¨ Change your dressing as told by your health care provider. °¨ Leave stitches (sutures), skin glue, or adhesive strips in place. These skin closures may need to stay in place for 2 weeks or longer. If adhesive strip edges start to loosen and curl up, you may trim the loose edges. Do not remove adhesive strips completely unless your health care provider tells you to do that. °· Do not take baths, swim, or use a hot tub until your health care provider approves. °· You may shower 24-48 hours after the procedure or as told by your health care provider. °¨ Gently wash the site with plain soap and water. °¨ Pat the area dry with a clean towel. °¨ Do not rub the site. This may cause bleeding. °· Do not apply powder or lotion to the site. Keep the site clean and dry. °· Check your insertion site every day for signs of infection. Check for: °¨ Redness, swelling, or pain. °¨ Fluid or blood. °¨ Warmth. °¨ Pus or a bad smell. °Activity  °· Rest as told by your health care provider, usually for 1-2 days. °· Do not lift anything that is heavier than 10 lbs. (4.5 kg) or as told by your health care provider. °· Do not drive for 24 hours if you were given a medicine to help you relax (sedative). °· Do not drive or use heavy machinery while  taking prescription pain medicine. °General instructions  °· Return to your normal activities as told by your health care provider, usually in about a week. Ask your health care provider what activities are safe for you. °· If the catheter site starts bleeding, lie flat and put pressure on the site. If the bleeding does not stop, get help right away. This is a medical emergency. °· Drink enough fluid to keep your urine clear or pale yellow. This helps flush the contrast dye from your body. °· Take over-the-counter and prescription medicines only as told by your health care provider. °· Keep all follow-up visits as told by your health care provider. This is important. °Contact a health care provider if: °· You have a fever or chills. °· You have redness, swelling, or pain around your insertion site. °· You have fluid or blood coming from your insertion site. °· The insertion site feels warm to the touch. °· You have pus or a bad smell coming from your insertion site. °· You have bruising around the insertion site. °· You notice blood collecting in the tissue around the catheter site (hematoma). The hematoma may be painful to the touch. °Get help right away if: °· You have severe pain at the catheter insertion area. °· The catheter insertion area swells very fast. °· The catheter insertion area is bleeding, and the bleeding does not stop when you hold steady pressure on   the area. °· The area near or just beyond the catheter insertion site becomes pale, cool, tingly, or numb. °These symptoms may represent a serious problem that is an emergency. Do not wait to see if the symptoms will go away. Get medical help right away. Call your local emergency services (911 in the [place] Do not drive yourself to the hospital. °Summary °· After the procedure, it is common to have bruising and tenderness at the catheter insertion area. °· After the procedure, it is important to rest and drink plenty of fluids. °· Do not take baths,  swim, or use a hot tub until your health care provider says it is okay to do so. You may shower 24-48 hours after the procedure or as told by your health care provider. °· If the catheter site starts bleeding, lie flat and put pressure on the site. If the bleeding does not stop, get help right away. This is a medical emergency. °This information is not intended to replace advice given to you by your health care provider. Make sure you discuss any questions you have with your health care provider. °Document Released: 02/26/2005 Document Revised: 07/15/2016 Document Reviewed: 07/15/2016 °Elsevier Interactive Patient Education © 2017 [employer] ° °

## 2016-12-31 NOTE — H&P (Signed)
[facility] History & Physical Update  The patient was interviewed and re-examined.  The patient's previous History and Physical has been reviewed and is unchanged.  There is no change in the plan of care. We plan to proceed with the scheduled procedure.  Leotis Pain, MD  12/31/2016, 8:36 AM

## 2017-01-01 ENCOUNTER — Encounter: Payer: Self-pay | Admitting: Vascular Surgery

## 2017-01-01 LAB — GLUCOSE, CAPILLARY: Glucose-Capillary: 74 mg/dL (ref 65–99)

## 2017-01-04 ENCOUNTER — Encounter
Admission: RE | Admit: 2017-01-04 | Discharge: 2017-01-04 | Disposition: A | Discharge status: Home / Self Care | Payer: Medicare Other | Source: Ambulatory Visit | Attending: Urology | Admitting: Urology

## 2017-01-04 NOTE — H&P (Signed)
NAME:  CURREN, MOHRMANN                   ACCOUNT NO.:  MEDICAL RECORD NO.:  [id number]  LOCATION:                                 FACILITY:  PHYSICIAN:  Maryan Puls          DATE OF BIRTH:  12/30/1949  DATE OF ADMISSION: DATE OF DISCHARGE:                            HISTORY AND PHYSICAL   SAME-DAY SURGERY:  Jan 05, 2017.  CHIEF COMPLAINT:  Hematuria.  HISTORY:  Mr. Cornforth is a 76 year old Caucasian male, who presented to the office with gross hematuria associated with lower urinary tract symptoms.  The patient has history of elevated creatinine of 1.25 and did not receive IV contrast studies.  He was evaluated with a renal ultrasound on Dec 23, 2016 which indicated the presence of left hydronephrosis without obvious source for obstruction.  He had some small renal cysts as well.  He underwent cystoscopy in the office on Dec 24, 2016 which indicated lateral lobe prostatic hypertrophy and a 6 mm polyp in the proximal penile urethra.  The polyp had the appearance of a fibroepithelial polyp.  The patient also had a urine cytology which was negative for malignant cells.  He comes in now for cystoscopy with transurethral resection of the urethral polyp and left retrograde pyelogram with possible left ureteroscopy and other interventions.  PAST MEDICAL HISTORY:  The patient was allergic to Percocet.  CURRENT MEDICATIONS:  Include atenolol, lovastatin, aspirin, Januvia, finasteride, tamsulosin, and Prilosec.  PAST SURGICAL HISTORY:  Coronary bypass graft x2, 1996.  Removal of colon polyps 2001.  SOCIAL HISTORY:  The patient denied cigarette use.  He consumes 3 alcoholic beverages per week.  FAMILY HISTORY:  Was negative for urological disease.  PAST AND CURRENT MEDICAL CONDITIONS:  Stage T2 N2 M0, Gleason grade 4+4 adenocarcinoma of prostate, status post bilateral orchiectomy 2016. Most recent PSA is 0.6 nanograms/mL on December 15, 2016.  Diabetes, hypertension,  hypercholesterolemia, and GERD.  REVIEW OF SYSTEMS:  The patient denied chest pain or shortness of breath.  He does have sleep apnea.  The patient also has BPH with lower urinary tract symptoms and prostate cancer.  PHYSICAL EXAMINATION:  GENERAL:  Obese white male in no distress. HEENT:  Sclerae were clear. NECK:  Supple.  No palpable cervical adenopathy.  LUNGS:  Clear to auscultation. CARDIOVASCULAR:  Regular rhythm and rate without audible murmurs. ABDOMEN:  Soft, nontender abdomen. GU:  Uncircumcised.  Testes absent. RECTAL:  20 g, flat, smooth nontender prostate. NEUROMUSCULAR:  Alert and oriented x3.  IMPRESSION: 1. Gross hematuria. 2. 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 3. Left hydronephrosis of uncertain etiology. 4. Prostate cancer status post bilateral orchiectomy in 2016.  PLAN: 1. Transurethral biopsy and resection of urethral tumor. 2. Cystoscopy with left retrograde and ureteroscopy with possible     other interventions.          ______________________________ Maryan Puls     MW/MEDQ  D:  01/01/2017  T:  01/01/2017  Job:  [id number]

## 2017-01-04 NOTE — Patient Instructions (Signed)
Your procedure is scheduled on: 01/05/17 Report to [facility]. 2ND FLOOR MEDICAL MALL ENTRANCE. To find out your arrival time please call [phone] between 1PM - 3PM on today.  Remember: Instructions that are not followed completely may result in serious medical risk, up to and including death, or upon the discretion of your surgeon and anesthesiologist your surgery may need to be rescheduled.    __X__ 1. Do not eat food or drink liquids after midnight. No gum chewing or hard candies.     __X__ 2. No Alcohol for 24 hours before or after surgery.   ____ 3. Bring all medications with you on the day of surgery if instructed.    __X__ 4. Notify your doctor if there is any change in your medical condition     (cold, fever, infections).             ___x__5. No smoking within 24 hours of your surgery.     Do not wear jewelry, make-up, hairpins, clips or nail polish.  Do not wear lotions, powders, or perfumes.   Do not shave 48 hours prior to surgery. Men may shave face and neck.  Do not bring valuables to the hospital.    [facility] is not responsible for any belongings or valuables.               Contacts, dentures or bridgework may not be worn into surgery.  Leave your suitcase in the car. After surgery it may be brought to your room.  For patients admitted to the hospital, discharge time is determined by your                treatment team.   Patients discharged the day of surgery will not be allowed to drive home.   Please read over the following fact sheets that you were given:    __x__ Take these medicines the morning of surgery with A SIP OF WATER:    1. atenolol  2. finasteride  3. lovastatin  4. omeprazole  5.  6.  ____ Fleet Enema (as directed)   ____ Use CHG Soap as directed  ____ Use inhalers on the day of surgery  ____ Stop metformin 2 days prior to surgery    ____ Take 1/2 of usual insulin dose the night before surgery and none on the morning of surgery.    __x__ Stop Coumadin/Plavix/aspirin on already stopped  __X__ Stop Anti-inflammatories such as Advil, Aleve, Ibuprofen, Motrin, Naproxen, Naprosyn, Goodies,powder, or aspirin products.  OK to take Tylenol.   ____ Stop supplements until after surgery.    ____ Bring C-Pap to the hospital.

## 2017-01-05 ENCOUNTER — Encounter
Admission: RE | Disposition: A | Discharge status: Home / Self Care | Payer: Self-pay | Source: Ambulatory Visit | Attending: Urology

## 2017-01-05 ENCOUNTER — Encounter: Payer: Self-pay | Admitting: *Deleted

## 2017-01-05 ENCOUNTER — Ambulatory Visit: Payer: Medicare Other | Admitting: Certified Registered"

## 2017-01-05 ENCOUNTER — Ambulatory Visit
Admission: RE | Admit: 2017-01-05 | Discharge: 2017-01-05 | Disposition: A | Discharge status: Home / Self Care | Payer: Medicare Other | Source: Ambulatory Visit | Attending: Urology | Admitting: Urology

## 2017-01-05 DIAGNOSIS — I10 Essential (primary) hypertension: Secondary | ICD-10-CM | POA: Insufficient documentation

## 2017-01-05 DIAGNOSIS — Z8546 Personal history of malignant neoplasm of prostate: Secondary | ICD-10-CM | POA: Insufficient documentation

## 2017-01-05 DIAGNOSIS — Z7984 Long term (current) use of oral hypoglycemic drugs: Secondary | ICD-10-CM | POA: Diagnosis not present

## 2017-01-05 DIAGNOSIS — N400 Enlarged prostate without lower urinary tract symptoms: Secondary | ICD-10-CM | POA: Diagnosis not present

## 2017-01-05 DIAGNOSIS — N049 Nephrotic syndrome with unspecified morphologic changes: Secondary | ICD-10-CM | POA: Diagnosis not present

## 2017-01-05 DIAGNOSIS — I2510 Atherosclerotic heart disease of native coronary artery without angina pectoris: Secondary | ICD-10-CM | POA: Diagnosis not present

## 2017-01-05 DIAGNOSIS — C680 Malignant neoplasm of urethra: Secondary | ICD-10-CM | POA: Insufficient documentation

## 2017-01-05 DIAGNOSIS — N1330 Unspecified hydronephrosis: Secondary | ICD-10-CM | POA: Insufficient documentation

## 2017-01-05 DIAGNOSIS — E1151 Type 2 diabetes mellitus with diabetic peripheral angiopathy without gangrene: Secondary | ICD-10-CM | POA: Diagnosis not present

## 2017-01-05 DIAGNOSIS — F17200 Nicotine dependence, unspecified, uncomplicated: Secondary | ICD-10-CM | POA: Insufficient documentation

## 2017-01-05 DIAGNOSIS — K219 Gastro-esophageal reflux disease without esophagitis: Secondary | ICD-10-CM | POA: Insufficient documentation

## 2017-01-05 DIAGNOSIS — Z79899 Other long term (current) drug therapy: Secondary | ICD-10-CM | POA: Insufficient documentation

## 2017-01-05 DIAGNOSIS — Z9079 Acquired absence of other genital organ(s): Secondary | ICD-10-CM | POA: Insufficient documentation

## 2017-01-05 DIAGNOSIS — R310 Gross hematuria: Secondary | ICD-10-CM | POA: Diagnosis not present

## 2017-01-05 DIAGNOSIS — E7800 Pure hypercholesterolemia, unspecified: Secondary | ICD-10-CM | POA: Insufficient documentation

## 2017-01-05 DIAGNOSIS — R319 Hematuria, unspecified: Secondary | ICD-10-CM | POA: Diagnosis present

## 2017-01-05 DIAGNOSIS — Z7982 Long term (current) use of aspirin: Secondary | ICD-10-CM | POA: Insufficient documentation

## 2017-01-05 DIAGNOSIS — N323 Diverticulum of bladder: Secondary | ICD-10-CM | POA: Insufficient documentation

## 2017-01-05 DIAGNOSIS — Z951 Presence of aortocoronary bypass graft: Secondary | ICD-10-CM | POA: Insufficient documentation

## 2017-01-05 DIAGNOSIS — N368 Other specified disorders of urethra: Secondary | ICD-10-CM

## 2017-01-05 HISTORY — PX: CYSTOSCOPY W/ RETROGRADES: SHX1426

## 2017-01-05 HISTORY — PX: TRANSURETHRAL RESECTION OF BLADDER TUMOR: SHX2575

## 2017-01-05 HISTORY — PX: URETEROSCOPY: SHX842

## 2017-01-05 LAB — GLUCOSE, CAPILLARY
Glucose-Capillary: 120 mg/dL — ABNORMAL HIGH (ref 65–99)
Glucose-Capillary: 116 mg/dL — ABNORMAL HIGH (ref 65–99)

## 2017-01-05 SURGERY — CYSTOSCOPY, WITH RETROGRADE PYELOGRAM
Anesthesia: General

## 2017-01-05 MED ORDER — HYDROCODONE-ACETAMINOPHEN 10-325 MG PO TABS
1.0000 | ORAL_TABLET | Freq: Four times a day (QID) | ORAL | Status: DC | PRN
  Administered 2017-01-05: 1 via ORAL

## 2017-01-05 MED ORDER — LIDOCAINE HCL 2 % EX GEL
CUTANEOUS | Status: AC
  Filled 2017-01-05: qty 10

## 2017-01-05 MED ORDER — UROGESIC-BLUE 81.6 MG PO TABS: 1.0000 | ORAL_TABLET | Freq: Four times a day (QID) | ORAL | 3 refills | Status: DC | PRN

## 2017-01-05 MED ORDER — ONDANSETRON HCL 4 MG/2ML IJ SOLN: 4.0000 mg | Freq: Once | INTRAMUSCULAR | 0 refills | Status: DC | PRN

## 2017-01-05 MED ORDER — LEVOFLOXACIN IN D5W 500 MG/100ML IV SOLN
500.0000 mg | Freq: Once | INTRAVENOUS | Status: AC
  Administered 2017-01-05: 500 mg via INTRAVENOUS

## 2017-01-05 MED ORDER — EPHEDRINE SULFATE 50 MG/ML IJ SOLN
INTRAMUSCULAR | Status: AC
  Filled 2017-01-05: qty 1

## 2017-01-05 MED ORDER — FENTANYL CITRATE (PF) 100 MCG/2ML IJ SOLN
INTRAMUSCULAR | Status: AC
  Administered 2017-01-05: 25 ug via INTRAVENOUS
  Filled 2017-01-05: qty 2

## 2017-01-05 MED ORDER — GLYCOPYRROLATE 0.2 MG/ML IJ SOLN
INTRAMUSCULAR | Status: DC | PRN
  Administered 2017-01-05: 0.2 mg via INTRAVENOUS

## 2017-01-05 MED ORDER — PROPOFOL 10 MG/ML IV BOLUS
INTRAVENOUS | Status: AC
  Filled 2017-01-05: qty 20

## 2017-01-05 MED ORDER — GLYCOPYRROLATE 0.2 MG/ML IJ SOLN
INTRAMUSCULAR | Status: AC
  Filled 2017-01-05: qty 1

## 2017-01-05 MED ORDER — ONDANSETRON HCL 4 MG/2ML IJ SOLN
INTRAMUSCULAR | Status: DC | PRN
  Administered 2017-01-05: 4 mg via INTRAVENOUS

## 2017-01-05 MED ORDER — LEVOFLOXACIN 500 MG PO TABS: 250.0000 mg | ORAL_TABLET | Freq: Every day | ORAL | 0 refills | Status: DC

## 2017-01-05 MED ORDER — PROPOFOL 10 MG/ML IV BOLUS
INTRAVENOUS | Status: DC | PRN
  Administered 2017-01-05: 170 mg via INTRAVENOUS

## 2017-01-05 MED ORDER — FENTANYL CITRATE (PF) 100 MCG/2ML IJ SOLN
INTRAMUSCULAR | Status: DC | PRN
  Administered 2017-01-05 (×2): 50 ug via INTRAVENOUS

## 2017-01-05 MED ORDER — EPHEDRINE SULFATE 50 MG/ML IJ SOLN
INTRAMUSCULAR | Status: DC | PRN
  Administered 2017-01-05: 10 mg via INTRAVENOUS

## 2017-01-05 MED ORDER — MIDAZOLAM HCL 2 MG/2ML IJ SOLN
INTRAMUSCULAR | Status: AC
  Filled 2017-01-05: qty 2

## 2017-01-05 MED ORDER — ONDANSETRON HCL 4 MG/2ML IJ SOLN: 4.0000 mg | Freq: Once | INTRAMUSCULAR | Status: DC | PRN

## 2017-01-05 MED ORDER — HYDROCODONE-ACETAMINOPHEN 10-325 MG PO TABS: 2.0000 | ORAL_TABLET | Freq: Four times a day (QID) | ORAL | Status: DC | PRN

## 2017-01-05 MED ORDER — SODIUM CHLORIDE 0.9 % IJ SOLN
INTRAMUSCULAR | Status: AC
  Filled 2017-01-05: qty 10

## 2017-01-05 MED ORDER — FENTANYL CITRATE (PF) 100 MCG/2ML IJ SOLN
25.0000 ug | INTRAMUSCULAR | Status: DC | PRN
  Administered 2017-01-05 (×2): 25 ug via INTRAVENOUS

## 2017-01-05 MED ORDER — BELLADONNA ALKALOIDS-OPIUM 16.2-60 MG RE SUPP
RECTAL | Status: AC
  Filled 2017-01-05: qty 1

## 2017-01-05 MED ORDER — LIDOCAINE HCL (PF) 2 % IJ SOLN
INTRAMUSCULAR | Status: AC
  Filled 2017-01-05: qty 2

## 2017-01-05 MED ORDER — FENTANYL CITRATE (PF) 100 MCG/2ML IJ SOLN
INTRAMUSCULAR | Status: AC
  Filled 2017-01-05: qty 2

## 2017-01-05 MED ORDER — SODIUM CHLORIDE 0.9 % IV SOLN
INTRAVENOUS | Status: DC
  Administered 2017-01-05 (×2): via INTRAVENOUS

## 2017-01-05 MED ORDER — HYDROCODONE-ACETAMINOPHEN 10-325 MG PO TABS
ORAL_TABLET | ORAL | Status: DC
Start: 2017-01-05 — End: 2017-01-05
  Filled 2017-01-05: qty 1

## 2017-01-05 MED ORDER — MIDAZOLAM HCL 2 MG/2ML IJ SOLN
INTRAMUSCULAR | Status: DC | PRN
  Administered 2017-01-05: 1 mg via INTRAVENOUS

## 2017-01-05 MED ORDER — ONDANSETRON HCL 4 MG/2ML IJ SOLN
INTRAMUSCULAR | Status: AC
  Filled 2017-01-05: qty 2

## 2017-01-05 MED ORDER — DOCUSATE SODIUM 100 MG PO CAPS: 200.0000 mg | ORAL_CAPSULE | Freq: Two times a day (BID) | ORAL | 3 refills | Status: AC

## 2017-01-05 MED ORDER — LEVOFLOXACIN IN D5W 500 MG/100ML IV SOLN
INTRAVENOUS | Status: AC
  Filled 2017-01-05: qty 100

## 2017-01-05 MED ORDER — LIDOCAINE HCL (CARDIAC) 20 MG/ML IV SOLN
INTRAVENOUS | Status: DC | PRN
  Administered 2017-01-05: 100 mg via INTRAVENOUS

## 2017-01-05 SURGICAL SUPPLY — 28 items
BAG DRAIN CYSTO-URO LG1000N (MISCELLANEOUS) ×4 IMPLANT
CATH FOL LX CONE TIP  8F (CATHETERS)
CATH FOL LX CONE TIP 8F (CATHETERS) IMPLANT
CATH FOLEY 2WAY SIL 20X30 (CATHETERS) ×4 IMPLANT
CATH URETL 5X70 OPEN END (CATHETERS) ×4 IMPLANT
CONRAY 43 FOR UROLOGY 50M (MISCELLANEOUS) ×4 IMPLANT
DRAPE INCISE 23X17 IOBAN STRL (DRAPES) ×1
DRAPE INCISE IOBAN 23X17 STRL (DRAPES) ×3 IMPLANT
GLOVE BIO SURGEON STRL SZ7 (GLOVE) ×8 IMPLANT
GLOVE BIO SURGEON STRL SZ7.5 (GLOVE) ×4 IMPLANT
GOWN STRL REUS W/ TWL LRG LVL3 (GOWN DISPOSABLE) ×3 IMPLANT
GOWN STRL REUS W/ TWL LRG LVL4 (GOWN DISPOSABLE) IMPLANT
GOWN STRL REUS W/ TWL XL LVL3 (GOWN DISPOSABLE) ×3 IMPLANT
GOWN STRL REUS W/TWL LRG LVL3 (GOWN DISPOSABLE) ×1
GOWN STRL REUS W/TWL LRG LVL4 (GOWN DISPOSABLE)
GOWN STRL REUS W/TWL XL LVL3 (GOWN DISPOSABLE) ×1
GUIDEWIRE STR ZIPWIRE 035X150 (MISCELLANEOUS) ×8 IMPLANT
KIT RM TURNOVER CYSTO AR (KITS) ×4 IMPLANT
LOOP CUT BIPOLAR 24F LRG (ELECTROSURGICAL) ×4 IMPLANT
PACK CYSTO AR (MISCELLANEOUS) ×4 IMPLANT
PREP PVP WINGED SPONGE (MISCELLANEOUS) ×4 IMPLANT
SET CYSTO W/LG BORE CLAMP LF (SET/KITS/TRAYS/PACK) ×4 IMPLANT
SOL .9 NS 3000ML IRR  AL (IV SOLUTION) ×1
SOL .9 NS 3000ML IRR UROMATIC (IV SOLUTION) ×3 IMPLANT
SOL PREP PVP 2OZ (MISCELLANEOUS) ×4
SOLUTION PREP PVP 2OZ (MISCELLANEOUS) ×3 IMPLANT
SURGILUBE 2OZ TUBE FLIPTOP (MISCELLANEOUS) ×4 IMPLANT
WATER STERILE IRR 1000ML POUR (IV SOLUTION) ×4 IMPLANT

## 2017-01-05 NOTE — Anesthesia Preprocedure Evaluation (Signed)
Anesthesia Evaluation  Patient identified by MRN, date of birth, ID band Patient awake    Reviewed: Allergy & Precautions, NPO status , Patient's Chart, lab work & pertinent test results  History of Anesthesia Complications Negative for: history of anesthetic complications  Airway Mallampati: III       Dental  (+) Lower Dentures, Upper Dentures   Pulmonary Current Smoker,           Cardiovascular hypertension, Pt. on medications and Pt. on home beta blockers + CAD, + CABG and + Peripheral Vascular Disease       Neuro/Psych negative neurological ROS     GI/Hepatic Neg liver ROS, GERD  Medicated and Controlled,  Endo/Other  diabetes, Oral Hypoglycemic Agents  Renal/GU negative Renal ROS     Musculoskeletal   Abdominal   Peds  Hematology negative hematology ROS (+)   Anesthesia Other Findings   Reproductive/Obstetrics                             Anesthesia Physical Anesthesia Plan  ASA: III  Anesthesia Plan: General   Post-op Pain Management:    Induction: Intravenous  Airway Management Planned: LMA  Additional Equipment:   Intra-op Plan:   Post-operative Plan:   Informed Consent: I have reviewed the patients History and Physical, chart, labs and discussed the procedure including the risks, benefits and alternatives for the proposed anesthesia with the patient or authorized representative who has indicated his/her understanding and acceptance.     Plan Discussed with:   Anesthesia Plan Comments:         Anesthesia Quick Evaluation

## 2017-01-05 NOTE — Anesthesia Procedure Notes (Signed)
Procedure Name: LMA Insertion Date/Time: 01/05/2017 1:15 PM Performed by: Silvana Newness Pre-anesthesia Checklist: Patient identified, Emergency Drugs available, Suction available, Patient being monitored and Timeout performed Patient Re-evaluated:Patient Re-evaluated prior to inductionOxygen Delivery Method: Circle system utilized Preoxygenation: Pre-oxygenation with 100% oxygen Intubation Type: IV induction Ventilation: Mask ventilation without difficulty LMA: LMA inserted LMA Size: 4.0 Number of attempts: 1 Placement Confirmation: positive ETCO2 and breath sounds checked- equal and bilateral Tube secured with: Tape Dental Injury: Teeth and Oropharynx as per pre-operative assessment

## 2017-01-05 NOTE — Discharge Instructions (Addendum)
AMBULATORY SURGERY  DISCHARGE INSTRUCTIONS   1) The drugs that you were given will stay in your system until tomorrow so for the next 24 hours you should not:  A) Drive an automobile B) Make any legal decisions C) Drink any alcoholic beverage   2) You may resume regular meals tomorrow.  Today it is better to start with liquids and gradually work up to solid foods.  You may eat anything you prefer, but it is better to start with liquids, then soup and crackers, and gradually work up to solid foods.   3) Please notify your doctor immediately if you have any unusual bleeding, trouble breathing, redness and pain at the surgery site, drainage, fever, or pain not relieved by medication. 4)   5) Your post-operative visit with Dr.                                     is: Date:                        Time:    Please call to schedule your post-operative visit.  6) Additional Instructions:      Dysuria Dysuria is pain or discomfort while urinating. The pain or discomfort may be felt in the tube that carries urine out of the bladder (urethra) or in the surrounding tissue of the genitals. The pain may also be felt in the groin area, lower abdomen, and lower back. You may have to urinate frequently or have the sudden feeling that you have to urinate (urgency). Dysuria can affect both men and women, but is more common in women. Dysuria can be caused by many different things, including:  Urinary tract infection in women.  Infection of the kidney or bladder.  Kidney stones or bladder stones.  Certain sexually transmitted infections (STIs), such as chlamydia.  Dehydration.  Inflammation of the vagina.  Use of certain medicines.  Use of certain soaps or scented products that cause irritation. Follow these instructions at home: Watch your dysuria for any changes. The following actions may help to reduce any discomfort you are feeling:  Drink enough fluid to keep your urine clear or  pale yellow.  Empty your bladder often. Avoid holding urine for long periods of time.  After a bowel movement or urination, women should cleanse from front to back, using each tissue only once.  Empty your bladder after sexual intercourse.  Take medicines only as directed by your health care provider.  If you were prescribed an antibiotic medicine, finish it all even if you start to feel better.  Avoid caffeine, tea, and alcohol. They can irritate the bladder and make dysuria worse. In men, alcohol may irritate the prostate.  Keep all follow-up visits as directed by your health care provider. This is important.  If you had any tests done to find the cause of dysuria, it is your responsibility to obtain your test results. Ask the lab or department performing the test when and how you will get your results. Talk with your health care provider if you have any questions about your results. Contact a health care provider if:  You develop pain in your back or sides.  You have a fever.  You have nausea or vomiting.  You have blood in your urine.  You are not urinating as often as you usually do. Get help right away if:  You  pain is severe and not relieved with medicines.  You are unable to hold down any fluids.  You or someone else notices a change in your mental function.  You have a rapid heartbeat at rest.  You have shaking or chills.  You feel extremely weak. This information is not intended to replace advice given to you by your health care provider. Make sure you discuss any questions you have with your health care provider. Document Released: 05/08/2004 Document Revised: 01/16/2016 Document Reviewed: 04/05/2014 Elsevier Interactive Patient Education  2017 [employer]. Cystoscopy, Care After Refer to this sheet in the next few weeks. These instructions provide you with information about caring for yourself after your procedure. Your health care provider may also give you  more specific instructions. Your treatment has been planned according to current medical practices, but problems sometimes occur. Call your health care provider if you have any problems or questions after your procedure. What can I expect after the procedure? After the procedure, it is common to have:  Mild pain when you urinate. Pain should stop within a few minutes after you urinate. This may last for up to 1 week.  A small amount of blood in your urine for several days.  Feeling like you need to urinate but producing only a small amount of urine. Follow these instructions at home:   Medicines   Take over-the-counter and prescription medicines only as told by your health care provider.  If you were prescribed an antibiotic medicine, take it as told by your health care provider. Do not stop taking the antibiotic even if you start to feel better. General instructions    Return to your normal activities as told by your health care provider. Ask your health care provider what activities are safe for you.  Do not drive for 24 hours if you received a sedative.  Watch for any blood in your urine. If the amount of blood in your urine increases, call your health care provider.  Follow instructions from your health care provider about eating or drinking restrictions.  If a tissue sample was removed for testing (biopsy) during your procedure, it is your responsibility to get your test results. Ask your health care provider or the department performing the test when your results will be ready.  Drink enough fluid to keep your urine clear or pale yellow.  Keep all follow-up visits as told by your health care provider. This is important. Contact a health care provider if:  You have pain that gets worse or does not get better with medicine, especially pain when you urinate.  You have difficulty urinating. Get help right away if:  You have more blood in your urine.  You have blood clots in  your urine.  You have abdominal pain.  You have a fever or chills.  You are unable to urinate. This information is not intended to replace advice given to you by your health care provider. Make sure you discuss any questions you have with your health care provider. Document Released: 02/27/2005 Document Revised: 01/16/2016 Document Reviewed: 06/27/2015 Elsevier Interactive Patient Education  2017 [employer] Hematuria, Adult Hematuria is blood in your urine. It can be caused by a bladder infection, kidney infection, prostate infection, kidney stone, or cancer of your urinary tract. Infections can usually be treated with medicine, and a kidney stone usually will pass through your urine. If neither of these is the cause of your hematuria, further workup to find out the reason may be  needed. It is very important that you tell your health care provider about any blood you see in your urine, even if the blood stops without treatment or happens without causing pain. Blood in your urine that happens and then stops and then happens again can be a symptom of a very serious condition. Also, pain is not a symptom in the initial stages of many urinary cancers. Follow these instructions at home:  Drink lots of fluid, 3-4 quarts a day. If you have been diagnosed with an infection, cranberry juice is especially recommended, in addition to large amounts of water.  Avoid caffeine, tea, and carbonated beverages because they tend to irritate the bladder.  Avoid alcohol because it may irritate the prostate.  Take all medicines as directed by your health care provider.  If you were prescribed an antibiotic medicine, finish it all even if you start to feel better.  If you have been diagnosed with a kidney stone, follow your health care provider's instructions regarding straining your urine to catch the stone.  Empty your bladder often. Avoid holding urine for long periods of time.  After a bowel movement,  women should cleanse front to back. Use each tissue only once.  Empty your bladder before and after sexual intercourse if you are a male. Contact a health care provider if:  You develop back pain.  You have a fever.  You have a feeling of sickness in your stomach (nausea) or vomiting.  Your symptoms are not better in 3 days. Return sooner if you are getting worse. Get help right away if:  You develop severe vomiting and are unable to keep the medicine down.  You develop severe back or abdominal pain despite taking your medicines.  You begin passing a large amount of blood or clots in your urine.  You feel extremely weak or faint, or you pass out. This information is not intended to replace advice given to you by your health care provider. Make sure you discuss any questions you have with your health care provider. Document Released: 08/10/2005 Document Revised: 01/16/2016 Document Reviewed: 04/10/2013 Elsevier Interactive Patient Education  2017 [employer] Indwelling Urinary Catheter Care, Adult Take good care of your catheter to keep it working and to prevent problems. How to wear your catheter Attach your catheter to your leg with tape (adhesive tape) or a leg strap. Make sure it is not too tight. If you use tape, remove any bits of tape that are already on the catheter. How to wear a drainage bag You should have:  A large overnight bag.  A small leg bag. Overnight Bag  You may wear the overnight bag at any time. Always keep the bag below the level of your bladder but off the floor. When you sleep, put a clean plastic bag in a wastebasket. Then hang the bag inside the wastebasket. Leg Bag  Never wear the leg bag at night. Always wear the leg bag below your knee. Keep the leg bag secure with a leg strap or tape. How to care for your skin  Clean the skin around the catheter at least once every day.  Shower every day. Do not take baths.  Put creams, lotions, or  ointments on your genital area only as told by your doctor.  Do not use powders, sprays, or lotions on your genital area. How to clean your catheter and your skin 1. Wash your hands with soap and water. 2. Wet a washcloth in warm water and gentle (  mild) soap. 3. Use the washcloth to clean the skin where the catheter enters your body. Clean downward and wipe away from the catheter in small circles. Do not wipe toward the catheter. 4. Pat the area dry with a clean towel. Make sure to clean off all soap. How to care for your drainage bags Empty your drainage bag when it is ?- full or at least 2-3 times a day. Replace your drainage bag once a month or sooner if it starts to smell bad or look dirty. Do not clean your drainage bag unless told by your doctor. Emptying a drainage bag   Supplies Needed  Rubbing alcohol.  Gauze pad or cotton ball.  Tape or a leg strap. Steps 1. Wash your hands with soap and water. 2. Separate (detach) the bag from your leg. 3. Hold the bag over the toilet or a clean container. Keep the bag below your hips and bladder. This stops pee (urine) from going back into the tube. 4. Open the pour spout at the bottom of the bag. 5. Empty the pee into the toilet or container. Do not let the pour spout touch any surface. 6. Put rubbing alcohol on a gauze pad or cotton ball. 7. Use the gauze pad or cotton ball to clean the pour spout. 8. Close the pour spout. 9. Attach the bag to your leg with tape or a leg strap. 10. Wash your hands. Changing a drainage bag  Supplies Needed  Alcohol wipes.  A clean drainage bag.  Adhesive tape or a leg strap. Steps 1. Wash your hands with soap and water. 2. Separate the dirty bag from your leg. 3. Pinch the rubber catheter with your fingers so that pee does not spill out. 4. Separate the catheter tube from the drainage tube where these tubes connect (at the connection valve). Do not let the tubes touch any surface. 5. Clean the  end of the catheter tube with an alcohol wipe. Use a different alcohol wipe to clean the end of the drainage tube. 6. Connect the catheter tube to the drainage tube of the clean bag. 7. Attach the new bag to the leg with adhesive tape or a leg strap. 8. Wash your hands. How to prevent infection and other problems  Never pull on your catheter or try to remove it. Pulling can damage tissue in your body.  Always wash your hands before and after touching your catheter.  If a leg strap gets wet, replace it with a dry one.  Drink enough fluids to keep your pee clear or pale yellow, or as told by your doctor.  Do not let the drainage bag or tubing touch the floor.  Wear cotton underwear.  If you are male, wipe from front to back after you poop (have a bowel movement).  Check on the catheter often to make sure it works and the tubing is not twisted. Get help if:  Your pee is cloudy.  Your pee smells unusually bad.  Your pee is not draining into the bag.  Your tube gets clogged.  Your catheter starts to leak.  Your bladder feels full. Get help right away if:  You have redness, swelling, or pain where the catheter enters your body.  You have fluid, pus, or a bad smell coming from the area where the catheter enters your body.  The area where the catheter enters your body feels warm.  You have a fever.  You have pain in your:  Stomach (abdomen).  Legs.  Lower back.  Bladder.  You see blood fill the catheter.  Your pee is pink or red.  You feel sick to your stomach (nauseous).  You throw up (vomit).  You have chills.  Your catheter gets pulled out. This information is not intended to replace advice given to you by your health care provider. Make sure you discuss any questions you have with your health care provider. Document Released: 12/05/2012 Document Revised: 07/08/2016 Document Reviewed: 01/23/2014 Elsevier Interactive Patient Education  2017 [employer].

## 2017-01-05 NOTE — Op Note (Signed)
Preoperative diagnosis: 1. Urethral tumor                                            2. Left hydronephrosis  Postoperative diagnosis: 1. Urethral tumor                                              2. 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 Transurethral resection of urethral tumor                       2. Left retrograde pyelogram                       3. Fluoroscopy                       4. Foley catheter placement    Surgeon: Otelia Limes. Yves Dill MD  Anesthesia: General  Indications:See the history and physical. After informed consent the above procedure(s) were requested     Technique and findings: After adequate general anesthesia been obtained the patient was placed into dorsal lithotomy position and the perineum was prepped and draped in the usual fashion. The 72 French the scope was coupled the camera and then placed into the distal urethra. 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 The scope could not be passed beyond this lesion. The scope was then removed. The 24 French resectoscope sheath was then passed into the distal urethra with the obturator in place. The bipolar resectoscope was then placed into the sheath and the urethral tumor was resected. Fragments were submitted to pathology. The base of the tumor was cauterized with the bipolar loop. At this point the resection scope was easily passed into the bladder. Patient had moderate lateral lobe prostatic hypertrophy. The bladder was moderately trabeculated. 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 No tumors were seen within the bladder or within the trigone. 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 At this point the resection scope was removed and the 21 [place] the scope reintroduced into the bladder. A 6 French cone tipped ureteral catheter was introduced into the medial ureteral  orifice and retrograde pyelogram revealed hydronephrosis of the upper pole moiety. The cone-tipped catheter was then introduced into the more lateral orifice and retrograde pyelogram revealed a nonobstructed lower pole moiety. At this point the bladder was drained and the cystoscope was removed. 10 cc of viscous Xylocaine was instilled within the urethra and the bladder. A 20 French silicone Foley catheter was placed and irrigated until clear. A B&O suppository was placed. The procedure was then terminated and patient transferred to the recovery room in stable condition.

## 2017-01-05 NOTE — Anesthesia Postprocedure Evaluation (Signed)
Anesthesia Post Note  Patient: Kenneth [name]  Procedure(s) Performed: Procedure(s) (LRB): CYSTOSCOPY WITH RETROGRADE PYELOGRAM (Left) URETEROSCOPY (N/A) TRANSURETHRAL RESECTION OF URETHRAL TUMOR  Patient location during evaluation: PACU Anesthesia Type: General Level of consciousness: awake and alert Pain management: pain level controlled Vital Signs Assessment: post-procedure vital signs reviewed and stable Respiratory status: spontaneous breathing and respiratory function stable Cardiovascular status: stable Anesthetic complications: no     Last Vitals:  Vitals:   01/05/17 1437 01/05/17 1452  BP: (!) 116/50 (!) 133/55  Pulse: 68 63  Resp: (!) 8 10  Temp: 36.4 C     Last Pain:  Vitals:   01/05/17 1452  TempSrc:   PainSc: 0-No pain                 KEPHART,WILLIAM K

## 2017-01-05 NOTE — Anesthesia Post-op Follow-up Note (Cosign Needed)
Anesthesia QCDR form completed.        

## 2017-01-05 NOTE — H&P (Signed)
Date of Initial H&P: 01/01/17  History reviewed, patient examined, no change in status, stable for surgery.

## 2017-01-05 NOTE — Transfer of Care (Signed)
Immediate Anesthesia Transfer of Care Note  Patient: Kenneth [name]  Procedure(s) Performed: Procedure(s): CYSTOSCOPY WITH RETROGRADE PYELOGRAM (Left) URETEROSCOPY (N/A) TRANSURETHRAL RESECTION OF URETHRAL TUMOR  Patient Location: PACU  Anesthesia Type:General  Level of Consciousness: drowsy and patient cooperative  Airway & Oxygen Therapy: Patient Spontanous Breathing and Patient connected to face mask oxygen  Post-op Assessment: Report given to RN and Post -op Vital signs reviewed and stable  Post vital signs: Reviewed and stable  Last Vitals:  Vitals:   01/05/17 1210  BP: (!) 156/57  Pulse: (!) 55  Resp: 14  Temp: 36.2 C    Last Pain:  Vitals:   01/05/17 1210  TempSrc: Oral  PainSc: 8          Complications: No apparent anesthesia complications

## 2017-01-06 ENCOUNTER — Encounter: Payer: Self-pay | Admitting: Urology

## 2017-01-06 ENCOUNTER — Telehealth (INDEPENDENT_AMBULATORY_CARE_PROVIDER_SITE_OTHER): Payer: Self-pay | Admitting: Vascular Surgery

## 2017-01-06 NOTE — Telephone Encounter (Signed)
Patient had surgery with urologist yesterday. Please touch base with them for pain control.

## 2017-01-06 NOTE — Telephone Encounter (Signed)
States husband just had a 2nd procedure on his legs and needs a refill on his hydrocodone. She stated that her pharmacy sent a request lastnight but advised her to follow up this morning. She advised that it would [facility] but I informed her that she would have to pick it up. Pt does have an appt with [facility] tomorrow 5/17 but she doesn't really want to wait until tomorrow. I advised her someone would contact her.

## 2017-01-06 NOTE — Telephone Encounter (Signed)
Called wife back and she states that the Urologist does not feel that what he did would not cause the pain in the patients leg and prescribed him a very mild medication that cost $100.  But she states that the patient is very uncomfortable and she doesn't know which way to turn but Dr. Rogers Blocker told her it has something to do with the vascular procedure not what he did yesterday. Please advise.

## 2017-01-06 NOTE — Telephone Encounter (Signed)
Spoke with patient wife and advised that [facility] would need them to do an [place] to evaluate the patient. We moved another patient to open a space for this patient to have an ABI and the wife stated they could not do 8:30 because the patient already has an appt in urology to have catheter removed.she states she will just wait until the get here at 10 to speak with Joelene Millin.

## 2017-01-07 ENCOUNTER — Telehealth (INDEPENDENT_AMBULATORY_CARE_PROVIDER_SITE_OTHER): Payer: Self-pay | Admitting: Vascular Surgery

## 2017-01-07 ENCOUNTER — Ambulatory Visit (INDEPENDENT_AMBULATORY_CARE_PROVIDER_SITE_OTHER): Payer: Medicare Other | Admitting: Vascular Surgery

## 2017-01-07 ENCOUNTER — Other Ambulatory Visit (INDEPENDENT_AMBULATORY_CARE_PROVIDER_SITE_OTHER): Payer: Self-pay | Admitting: Vascular Surgery

## 2017-01-07 ENCOUNTER — Encounter (INDEPENDENT_AMBULATORY_CARE_PROVIDER_SITE_OTHER): Payer: Self-pay | Admitting: Vascular Surgery

## 2017-01-07 VITALS — BP 157/61 | HR 62 | Resp 17 | Ht 69.0 in | Wt 220.0 lb

## 2017-01-07 DIAGNOSIS — M7989 Other specified soft tissue disorders: Secondary | ICD-10-CM

## 2017-01-07 DIAGNOSIS — M79605 Pain in left leg: Secondary | ICD-10-CM

## 2017-01-07 NOTE — Telephone Encounter (Signed)
Estill Bamberg from [facility] called wanting to know which Hydrocodone to use.  Hydrocodone 5mg  with no tylenol    Or  Hydrocodone 5/325

## 2017-01-07 NOTE — Telephone Encounter (Signed)
Wow. The script states hydrocodone. If I wanted Tylenol I would of wrote it. Please tell them just to follow what the script says thank you.

## 2017-01-08 ENCOUNTER — Ambulatory Visit (INDEPENDENT_AMBULATORY_CARE_PROVIDER_SITE_OTHER): Payer: Medicare Other | Admitting: Vascular Surgery

## 2017-01-08 ENCOUNTER — Other Ambulatory Visit (INDEPENDENT_AMBULATORY_CARE_PROVIDER_SITE_OTHER): Payer: Self-pay

## 2017-01-08 ENCOUNTER — Encounter (INDEPENDENT_AMBULATORY_CARE_PROVIDER_SITE_OTHER): Payer: Self-pay | Admitting: Vascular Surgery

## 2017-01-08 ENCOUNTER — Ambulatory Visit (INDEPENDENT_AMBULATORY_CARE_PROVIDER_SITE_OTHER): Payer: Medicare Other

## 2017-01-08 ENCOUNTER — Encounter (INDEPENDENT_AMBULATORY_CARE_PROVIDER_SITE_OTHER): Payer: Self-pay

## 2017-01-08 VITALS — BP 179/72 | HR 66 | Resp 16 | Ht 69.0 in | Wt 220.0 lb

## 2017-01-08 DIAGNOSIS — E785 Hyperlipidemia, unspecified: Secondary | ICD-10-CM

## 2017-01-08 DIAGNOSIS — M79605 Pain in left leg: Secondary | ICD-10-CM | POA: Diagnosis not present

## 2017-01-08 DIAGNOSIS — M7989 Other specified soft tissue disorders: Secondary | ICD-10-CM | POA: Diagnosis not present

## 2017-01-08 DIAGNOSIS — I6523 Occlusion and stenosis of bilateral carotid arteries: Secondary | ICD-10-CM | POA: Diagnosis not present

## 2017-01-08 DIAGNOSIS — I739 Peripheral vascular disease, unspecified: Secondary | ICD-10-CM

## 2017-01-08 DIAGNOSIS — I82412 Acute embolism and thrombosis of left femoral vein: Secondary | ICD-10-CM | POA: Diagnosis not present

## 2017-01-08 NOTE — Assessment & Plan Note (Signed)
Status post recent revascularization of the left lower extremity. Arterial status is now normal. Still with pain and swelling likely from DVT and lymphedema.

## 2017-01-08 NOTE — Assessment & Plan Note (Signed)
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 Continue to elevate the legs. Start on anticoagulation today.

## 2017-01-08 NOTE — Patient Instructions (Signed)
Deep Vein Thrombosis A deep vein thrombosis (DVT) is a blood clot (thrombus) that usually occurs in a deep, larger vein of the lower leg or the pelvis, or in an upper extremity such as the arm. These are dangerous and can lead to serious and even life-threatening complications if the clot travels to the lungs. A DVT can damage the valves in your leg veins so that instead of flowing upward, the blood pools in the lower leg. This is called post-thrombotic syndrome, and it can result in pain, swelling, discoloration, and sores on the leg. What are the causes? A DVT is caused by the formation of a blood clot in your leg, pelvis, or arm. Usually, several things contribute to the formation of blood clots. A clot may develop when:  Your blood flow slows down.  Your vein becomes damaged in some way.  You have a condition that makes your blood clot more easily.  What increases the risk? A DVT is more likely to develop in:  People who are older, especially over 60 years of age.  People who are overweight (obese).  People who sit or lie still for a long time, such as during long-distance travel (over 4 hours), bed rest, hospitalization, or during recovery from certain medical conditions like a stroke.  People who do not engage in much physical activity (sedentary lifestyle).  People who have chronic breathing disorders.  People who have a personal or family history of blood clots or blood clotting disease.  People who have peripheral vascular disease (PVD), diabetes, or some types of cancer.  People who have heart disease, especially if the person had a recent heart attack or has congestive heart failure.  People who have neurological diseases that affect the legs (leg paresis).  People who have had a traumatic injury, such as breaking a hip or leg.  People who have recently had major or lengthy surgery, especially on the hip, knee, or abdomen.  People who have had a central line placed  inside a large vein.  People who take medicines that contain the hormone estrogen. These include birth control pills and hormone replacement therapy.  Pregnancy or during childbirth or the postpartum period.  Long plane flights (over 8 hours).  What are the signs or symptoms?  Symptoms of a DVT can include:  Swelling of your leg or arm, especially if one side is much worse.  Warmth and redness of your leg or arm, especially if one side is much worse.  Pain in your arm or leg. If the clot is in your leg, symptoms may be more noticeable or worse when you stand or walk.  A feeling of pins and needles, if the clot is in the arm.  The symptoms of a DVT that has traveled to the lungs (pulmonary embolism, PE) usually start suddenly and include:  Shortness of breath while active or at rest.  Coughing or coughing up blood or blood-tinged mucus.  Chest pain that is often worse with deep breaths.  Rapid or irregular heartbeat.  Feeling light-headed or dizzy.  Fainting.  Feeling anxious.  Sweating.  There may also be pain and swelling in a leg if that is where the blood clot started. These symptoms may represent a serious problem that is an emergency. Do not wait to see if the symptoms will go away. Get medical help right away. Call your local emergency services (911 in the [place] Do not drive yourself to the hospital. How is this diagnosed? Your health   care provider will take a medical history and perform a physical exam. You may also have other tests, including:  Blood tests to assess the clotting properties of your blood.  Imaging tests, such as CT, ultrasound, MRI, X-ray, and other tests to see if you have clots anywhere in your body.  How is this treated? After a DVT is identified, it can be treated. The type of treatment that you receive depends on many factors, such as the cause of your DVT, your risk for bleeding or developing more clots, and other medical conditions that  you have. Sometimes, a combination of treatments is necessary. Treatment options may be combined and include:  Monitoring the blood clot with ultrasound.  Taking medicines by mouth, such as newer blood thinners (anticoagulants), thrombolytics, or warfarin.  Taking anticoagulant medicine by injection or through an IV tube.  Wearing compression stockings or using different types ofdevices.  Surgery (rare) to remove the blood clot or to place a filter in your abdomen to stop the blood clot from traveling to your lungs.  Treatments for a DVT are often divided into immediate treatment and long-term treatment (up to 3 months after DVT). You can work with your health care provider to choose the treatment program that is best for you. Follow these instructions at home: If you are taking a newer oral anticoagulant:  Take the medicine every single day at the same time each day.  Understand what foods and drugs interact with this medicine.  Understand that there are no regular blood tests required when using this medicine.  Understand the side effects of this medicine, including excessive bruising or bleeding. Ask your health care provider or pharmacist about other possible side effects. If you are taking warfarin:  Understand how to take warfarin and know which foods can affect how warfarin works in your body.  Understand that it is dangerous to take too much or too little warfarin. Too much warfarin increases the risk of bleeding. Too little warfarin continues to allow the risk for blood clots.  Follow your PT and INR blood testing schedule. The PT and INR results allow your health care provider to adjust your dose of warfarin. It is very important that you have your PT and INR tested as often as told by your health care provider.  Avoid major changes in your diet, or tell your health care provider before you change your diet. Arrange a visit with a registered dietitian to answer your  questions. Many foods, especially foods that are high in vitamin K, can interfere with warfarin and affect the PT and INR results. Eat a consistent amount of foods that are high in vitamin K, such as: ? Spinach, kale, broccoli, cabbage, collard greens, turnip greens, Brussels sprouts, peas, cauliflower, seaweed, and parsley. ? Beef liver and pork liver. ? Green tea. ? Soybean oil.  Tell your health care provider about any and all medicines, vitamins, and supplements that you take, including aspirin and other over-the-counter anti-inflammatory medicines. Be especially cautious with aspirin and anti-inflammatory medicines. Do not take those before you ask your health care provider if it is safe to do so. This is important because many medicines can interfere with warfarin and affect the PT and INR results.  Do not start or stop taking any over-the-counter or prescription medicine unless your health care provider or pharmacist tells you to do so. If you take warfarin, you will also need to do these things:  Hold pressure over cuts for longer than   usual.  Tell your dentist and other health care providers that you are taking warfarin before you have any procedures in which bleeding may occur.  Avoid alcohol or drink very small amounts. Tell your health care provider if you change your alcohol intake.  Do not use tobacco products, including cigarettes, chewing tobacco, and e-cigarettes. If you need help quitting, ask your health care provider.  Avoid contact sports.  General instructions  Take over-the-counter and prescription medicines only as told by your health care provider. Anticoagulant medicines can have side effects, including easy bruising and difficulty stopping bleeding. If you are prescribed an anticoagulant, you will also need to do these things: ? Hold pressure over cuts for longer than usual. ? Tell your dentist and other health care providers that you are taking anticoagulants  before you have any procedures in which bleeding may occur. ? Avoid contact sports.  Wear a medical alert bracelet or carry a medical alert card that says you have had a PE.  Ask your health care provider how soon you can go back to your normal activities. Stay active to prevent new blood clots from forming.  Make sure to exercise while traveling or when you have been sitting or standing for a long period of time. It is very important to exercise. Exercise your legs by walking or by tightening and relaxing your leg muscles often. Take frequent walks.  Wear compression stockings as told by your health care provider to help prevent more blood clots from forming.  Do not use tobacco products, including cigarettes, chewing tobacco, and e-cigarettes. If you need help quitting, ask your health care provider.  Keep all follow-up appointments with your health care provider. This is important. How is this prevented? Take these actions to decrease your risk of developing another DVT:  Exercise regularly. For at least 30 minutes every day, engage in: ? Activity that involves moving your arms and legs. ? Activity that encourages good blood flow through your body by increasing your heart rate.  Exercise your arms and legs every hour during long-distance travel (over 4 hours). Drink plenty of water and avoid drinking alcohol while traveling.  Avoid sitting or lying in bed for long periods of time without moving your legs.  Maintain a weight that is appropriate for your height. Ask your health care provider what weight is healthy for you.  If you are a woman who is over 35 years of age, avoid unnecessary use of medicines that contain estrogen. These include birth control pills.  Do not smoke, especially if you take estrogen medicines. If you need help quitting, ask your health care provider.  If you are hospitalized, prevention measures may include:  Early walking after surgery, as soon as your  health care provider says that it is safe.  Receiving anticoagulants to prevent blood clots.If you cannot take anticoagulants, other options may be available, such as wearing compression stockings or using different types of devices.  Get help right away if:  You have new or increased pain, swelling, or redness in an arm or leg.  You have numbness or tingling in an arm or leg.  You have shortness of breath while active or at rest.  You have chest pain.  You have a rapid or irregular heartbeat.  You feel light-headed or dizzy.  You cough up blood.  You notice blood in your vomit, bowel movement, or urine. These symptoms may represent a serious problem that is an emergency. Do not wait to see   if the symptoms will go away. Get medical help right away. Call your local emergency services (911 in the [place] Do not drive yourself to the hospital. This information is not intended to replace advice given to you by your health care provider. Make sure you discuss any questions you have with your health care provider. Document Released: 08/10/2005 Document Revised: 01/16/2016 Document Reviewed: 12/05/2014 Elsevier Interactive Patient Education  2017 [employer]  

## 2017-01-08 NOTE — Assessment & Plan Note (Signed)
A venous duplex performed today demonstrates partially occlusive acute appearing thrombus in the left common femoral vein and possibly in the left iliac system although this is difficult to visualize today. The lower part of the leg does not have DVT. I discussed the situation in detail with the patient. Given his pelvic and bladder issues, this is likely the nidus for clot formation in the pelvis. I would recommend he be started on anticoagulation immediately. Given his pronounced pain and swelling, it would certainly be reasonable to try to perform a thrombectomy procedure. This would be length with an IVC filter which would be temporary. I discussed the pathophysiology and natural history of DVT. I discussed that for very symptomatic DVT, intervention lowers the risk of pain and swelling long-term. He voices his understanding and desires to proceed with left leg venous thrombectomy and IVC filter placement.

## 2017-01-08 NOTE — Progress Notes (Signed)
MRN : [id number]  Kenneth [name] is a 76 y.o. ([birth year]) male who presents with chief complaint of  Chief Complaint  Patient presents with  . Re-evaluation    Positive for DVT  .  History of Present Illness: Patient returns today in follow up of Pain and swelling in the left leg. This really has not improved much after arterial revascularization last week. His bladder issues are better after a recent bladder procedure by his urologist. He has a wrap on his leg which have helped the swelling a little bit, but not much. His right leg still does not have any symptoms. He denies chest pain or shortness of breath. He denies fever or chills. A venous duplex performed today demonstrates partially occlusive acute appearing thrombus in the left common femoral vein and possibly in the left iliac system although this is difficult to visualize today. The lower part of the leg does not have DVT.         Current Outpatient Prescriptions  Medication Sig Dispense Refill  . aspirin 81 MG tablet Take 81 mg by mouth daily.    Marland Kitchen atenolol (TENORMIN) 25 MG tablet Take 25 mg by mouth daily.    . clopidogrel (PLAVIX) 75 MG tablet Take 1 tablet (75 mg total) by mouth daily with breakfast. 30 tablet 5  . finasteride (PROSCAR) 5 MG tablet Take 5 mg by mouth daily.    Marland Kitchen glipiZIDE (GLUCOTROL) 5 MG tablet Take 5 mg by mouth daily before breakfast.     . HYDROcodone-acetaminophen (NORCO/VICODIN) 5-325 MG tablet Take 1-2 tablets by mouth every 6 (six) hours as needed for moderate pain. 30 tablet 0  . lovastatin (MEVACOR) 20 MG tablet Take 20 mg by mouth at bedtime.    Marland Kitchen omeprazole (PRILOSEC) 20 MG capsule Take 20 mg by mouth every other day.    . sitaGLIPtin (JANUVIA) 100 MG tablet Take 100 mg by mouth daily.    . tamsulosin (FLOMAX) 0.4 MG CAPS capsule Take 0.4 mg by mouth.     No current facility-administered medications for this visit.         Past Medical History:  Diagnosis Date  .  Adenomatous polyps   . Carotid artery occlusion   . Coronary atherosclerosis of artery bypass graft   . Diabetes mellitus without complication ([place])   . GERD (gastroesophageal reflux disease)   . Hyperlipidemia   . Prostate cancer ([place])   . Prostate cancer [facility])          Past Surgical History:  Procedure Laterality Date  . COLON RESECTION    . COLONOSCOPY WITH PROPOFOL N/A 06/24/2015   Procedure: COLONOSCOPY WITH PROPOFOL;  Surgeon: Lollie Sails, MD;  Location: [facility] ENDOSCOPY;  Service: Endoscopy;  Laterality: N/A;  . CORONARY ARTERY BYPASS GRAFT     1996 at [place] 2 vessels  . ENDARTERECTOMY Right 09/25/2015   Procedure: ENDARTERECTOMY CAROTID;  Surgeon: Algernon Huxley, MD;  Location: [facility] ORS;  Service: Vascular;  Laterality: Right;  . Removal of Testicles      Social History        Social History  Substance Use Topics  . Smoking status: Current Every Day Smoker    Packs/day: 15.00    Years: 50.00    Types: Cigarettes  . Smokeless tobacco: Never Used  . Alcohol use Yes      Comment: rare    Family History      Family History  Problem Relation Age of Onset  .  Family history unknown: Yes         Allergies  Allergen Reactions  . Percocet [Oxycodone-Acetaminophen]      REVIEW OF SYSTEMS (Negative unless checked)  Constitutional: _0 Weight loss  _1 Fever  _2 Chills Cardiac: _3 Chest pain   _4 Chest pressure   _5 Palpitations   _6 Shortness of breath when laying flat   _7 Shortness of breath at rest   _8 Shortness of breath with exertion. Vascular:  _9 Pain in legs with walking   _10 Pain in legs at rest   _11 Pain in legs when laying flat   _12 Claudication   _13 Pain in feet when walking  _14 Pain in feet at rest  _15 Pain in feet when laying flat   _16 History of DVT   _17 Phlebitis   _18 Swelling in legs   _19 Varicose veins   _20 Non-healing ulcers Pulmonary:   _21 Uses home oxygen   _22 Productive cough   _23 Hemoptysis   _24 Wheeze  _25 COPD    _26 Asthma Neurologic:  _27 Dizziness  _28 Blackouts   _29 Seizures   _30 History of stroke   _31 History of TIA  _32 Aphasia   _33 Temporary blindness   _34 Dysphagia   _35 Weakness or numbness in arms   _36 Weakness or numbness in legs Musculoskeletal:  _37 Arthritis   _38 Joint swelling   _39 Joint pain   _40 Low back pain Hematologic:  _41 Easy bruising  _42 Easy bleeding   _43 Hypercoagulable state   _44 Anemic  _45 Hepatitis Gastrointestinal:  _46 Blood in stool   _47 Vomiting blood  _48 Gastroesophageal reflux/heartburn   _49 Difficulty swallowing. Genitourinary:  _50 Chronic kidney disease   _51 Difficult urination  _52 Frequent urination  _53 Burning with urination   _54 Blood in urine Skin:  _55 Rashes   _56 Ulcers   _57 Wounds Psychological:  _58 History of anxiety   _59  History of major depression.   Physical Examination  BP (!) 179/72 (BP Location: Right Arm)   Pulse 66   Resp 16   Ht _60  (1.753 m)   Wt 99.8 kg (220 lb)   BMI 32.49 kg/m  Gen:  WD/WN, NAD Head: [place]/AT, No temporalis wasting. Ear/Nose/Throat: Hearing grossly intact, nares w/o erythema or drainage, trachea midline Eyes: Conjunctiva clear. Sclera non-icteric Neck: Supple.  No JVD.  Pulmonary:  Good air movement, no use of accessory muscles.  Cardiac: RRR, normal S1, S2 Vascular:  Vessel Right Left  Radial Palpable Palpable  Ulnar Palpable Palpable  Brachial Palpable Palpable  Carotid Palpable, without bruit Palpable, without bruit  Aorta Not palpable N/A  Femoral Palpable Palpable  Popliteal Palpable Not Palpable  PT 1+ Palpable Not Palpable  DP Palpable 1+ Palpable   Gastrointestinal: soft, non-tender/non-distended Musculoskeletal: M/S 5/5 throughout.  No deformity or atrophy. 2-3+ left lower extremity edema Neurologic: Sensation grossly intact in extremities.  Symmetrical.  Speech is fluent.  Psychiatric: Judgment intact, Mood & affect appropriate for pt's clinical situation. Dermatologic: No rashes or ulcers noted.  No cellulitis or open  wounds.      Labs Recent Results (from the past 2160 hour(s))  CBC     Status: Abnormal   Collection Time: 12/25/16  6:05 PM  Result Value Ref Range   WBC 7.9 3.8 - 10.6 K/uL   RBC 4.57 4.40 - 5.90 MIL/uL   Hemoglobin 13.5 13.0 - 18.0 g/dL   HCT 39.2 (L) 40.0 - 52.0 %   MCV 85.8 80.0 - 100.0 fL   MCH 29.5 26.0 - 34.0 pg   MCHC 34.4 32.0 - 36.0 g/dL   RDW 13.9 11.5 - 14.5 %   Platelets 141 (L) 150 - 440 K/uL  Comprehensive metabolic panel     Status:  Abnormal   Collection Time: 12/25/16  6:05 PM  Result Value Ref Range   Sodium 135 135 - 145 mmol/L   Potassium 4.5 3.5 - 5.1 mmol/L   Chloride 102 101 - 111 mmol/L   CO2 23 22 - 32 mmol/L   Glucose, Bld 119 (H) 65 - 99 mg/dL   BUN 24 (H) 6 - 20 mg/dL   Creatinine, Ser 1.35 (H) 0.61 - 1.24 mg/dL   Calcium 10.0 8.9 - 10.3 mg/dL   Total Protein 7.4 6.5 - 8.1 g/dL   Albumin 4.4 3.5 - 5.0 g/dL   AST 23 15 - 41 U/L   ALT 18 17 - 63 U/L   Alkaline Phosphatase 57 38 - 126 U/L   Total Bilirubin 0.5 0.3 - 1.2 mg/dL   GFR calc non Af Amer 49 (L) >60 mL/min   GFR calc Af Amer 57 (L) >60 mL/min    Comment: (NOTE) The eGFR has been calculated using the CKD EPI equation. This calculation has not been validated in all clinical situations. eGFR's persistently <60 mL/min signify possible Chronic Kidney Disease.    Anion gap 10 5 - 15  Lactic acid, plasma     Status: None   Collection Time: 12/25/16  6:05 PM  Result Value Ref Range   Lactic Acid, Venous 1.3 0.5 - 1.9 mmol/L  BUN     Status: Abnormal   Collection Time: 12/29/16  9:26 AM  Result Value Ref Range   BUN 22 (H) 6 - 20 mg/dL  Creatinine, serum     Status: Abnormal   Collection Time: 12/29/16  9:26 AM  Result Value Ref Range   Creatinine, Ser 1.41 (H) 0.61 - 1.24 mg/dL   GFR calc non Af Amer 46 (L) >60 mL/min   GFR calc Af Amer 54 (L) >60 mL/min    Comment: (NOTE) The eGFR has been calculated using the CKD EPI equation. This calculation has not been validated in all  clinical situations. eGFR's persistently <60 mL/min signify possible Chronic Kidney Disease.   Glucose, capillary     Status: Abnormal   Collection Time: 12/31/16  8:06 AM  Result Value Ref Range   Glucose-Capillary 162 (H) 65 - 99 mg/dL  Glucose, capillary     Status: None   Collection Time: 12/31/16  2:37 PM  Result Value Ref Range   Glucose-Capillary 74 65 - 99 mg/dL  Glucose, capillary     Status: Abnormal   Collection Time: 01/05/17 12:46 PM  Result Value Ref Range   Glucose-Capillary 120 (H) 65 - 99 mg/dL  Glucose, capillary     Status: Abnormal   Collection Time: 01/05/17  2:40 PM  Result Value Ref Range   Glucose-Capillary 116 (H) 65 - 99 mg/dL    Radiology US Renal  Result Date: 12/23/2016 CLINICAL DATA:  Hematuria EXAM: RENAL / URINARY TRACT ULTRASOUND COMPLETE COMPARISON:  CT abdomen and pelvis October 08, 2014 FINDINGS: Right Kidney: Length: 13.2 cm. Echogenicity and renal cortical thickness are within normal limits. No perinephric fluid or hydronephrosis visualized. There is a cyst in the mid right kidney measuring 1.4 x 1.1 x 1.0 cm. No sonographically demonstrable calculus or ureterectasis. Left Kidney: Length: 13.3 cm. Echogenicity and renal cortical thickness are within normal limits. No perinephric fluid visualized. There is moderate pelvicaliectasis on the left. There is a cyst arising from the lower pole left kidney measuring 1.8 x 1.5 x 1.7 cm. No sonographically demonstrable calculus or ureterectasis. Bladder: Appears normal for degree of  bladder distention. Flow from the right ureter is noted within the urinary bladder by color Doppler examination. No flow from the left side evident. IMPRESSION: Hydronephrosis on the left without ureterectasis or demonstrable site of obstruction. No flow seen from the distal left ureter into the urinary bladder by color Doppler ultrasound. This finding may warrant noncontrast enhanced CT to assess for potential calculus in the left  ureter. Small cysts in each kidney. Study otherwise unremarkable. These results will be called to the ordering clinician or representative by the Radiologist Assistant, and communication documented in the PACS or zVision Dashboard. Electronically Signed   By: Lowella Grip III M.D.   On: 12/23/2016 11:34     Assessment/Plan  Carotid stenosis No recent symptoms. Followed regularly  Hyperlipidemia lipid control important in reducing the progression of atherosclerotic disease. Continue statin therapy  Pain and swelling of left lower extremity This is likely a combination of mixed arterial disease, lymphedema, and now finding of DVT likely originating from the left pelvis. Continue to elevate the legs. Start on anticoagulation today.  PAD (peripheral artery disease) (HCC) Status post recent revascularization of the left lower extremity. Arterial status is now normal. Still with pain and swelling likely from DVT and lymphedema.  DVT femoral (deep venous thrombosis) with thrombophlebitis, left (HCC) A venous duplex performed today demonstrates partially occlusive acute appearing thrombus in the left common femoral vein and possibly in the left iliac system although this is difficult to visualize today. The lower part of the leg does not have DVT. I discussed the situation in detail with the patient. Given his pelvic and bladder issues, this is likely the nidus for clot formation in the pelvis. I would recommend he be started on anticoagulation immediately. Given his pronounced pain and swelling, it would certainly be reasonable to try to perform a thrombectomy procedure. This would be length with an IVC filter which would be temporary. I discussed the pathophysiology and natural history of DVT. I discussed that for very symptomatic DVT, intervention lowers the risk of pain and swelling long-term. He voices his understanding and desires to proceed with left leg venous thrombectomy and IVC filter  placement.    Leotis Pain, MD  01/08/2017 9:48 AM    This note was created with Dragon medical transcription system.  Any errors from dictation are purely unintentional

## 2017-01-08 NOTE — Telephone Encounter (Signed)
Pharmacy was called back and told to use the 5/325

## 2017-01-10 MED ORDER — CEFAZOLIN SODIUM-DEXTROSE 1-4 GM/50ML-% IV SOLN: 1.0000 g | Freq: Once | INTRAVENOUS | Status: AC

## 2017-01-11 ENCOUNTER — Encounter: Payer: Self-pay | Admitting: *Deleted

## 2017-01-11 ENCOUNTER — Other Ambulatory Visit: Payer: Self-pay | Admitting: Pathology

## 2017-01-11 ENCOUNTER — Encounter
Admission: RE | Disposition: A | Discharge status: Home / Self Care | Payer: Self-pay | Source: Ambulatory Visit | Attending: Vascular Surgery

## 2017-01-11 ENCOUNTER — Ambulatory Visit
Admission: RE | Admit: 2017-01-11 | Discharge: 2017-01-11 | Disposition: A | Discharge status: Home / Self Care | Payer: Medicare Other | Source: Ambulatory Visit | Attending: Vascular Surgery | Admitting: Vascular Surgery

## 2017-01-11 ENCOUNTER — Other Ambulatory Visit (INDEPENDENT_AMBULATORY_CARE_PROVIDER_SITE_OTHER): Payer: Self-pay | Admitting: Vascular Surgery

## 2017-01-11 DIAGNOSIS — I82412 Acute embolism and thrombosis of left femoral vein: Secondary | ICD-10-CM | POA: Diagnosis not present

## 2017-01-11 DIAGNOSIS — Z885 Allergy status to narcotic agent status: Secondary | ICD-10-CM | POA: Diagnosis not present

## 2017-01-11 DIAGNOSIS — F17210 Nicotine dependence, cigarettes, uncomplicated: Secondary | ICD-10-CM | POA: Insufficient documentation

## 2017-01-11 DIAGNOSIS — Z7984 Long term (current) use of oral hypoglycemic drugs: Secondary | ICD-10-CM | POA: Insufficient documentation

## 2017-01-11 DIAGNOSIS — I8290 Acute embolism and thrombosis of unspecified vein: Secondary | ICD-10-CM

## 2017-01-11 DIAGNOSIS — Z86010 Personal history of colonic polyps: Secondary | ICD-10-CM | POA: Insufficient documentation

## 2017-01-11 DIAGNOSIS — I82422 Acute embolism and thrombosis of left iliac vein: Secondary | ICD-10-CM | POA: Diagnosis not present

## 2017-01-11 DIAGNOSIS — Z7982 Long term (current) use of aspirin: Secondary | ICD-10-CM | POA: Insufficient documentation

## 2017-01-11 DIAGNOSIS — Z9889 Other specified postprocedural states: Secondary | ICD-10-CM | POA: Insufficient documentation

## 2017-01-11 DIAGNOSIS — E785 Hyperlipidemia, unspecified: Secondary | ICD-10-CM | POA: Insufficient documentation

## 2017-01-11 DIAGNOSIS — E119 Type 2 diabetes mellitus without complications: Secondary | ICD-10-CM | POA: Insufficient documentation

## 2017-01-11 DIAGNOSIS — I6529 Occlusion and stenosis of unspecified carotid artery: Secondary | ICD-10-CM | POA: Insufficient documentation

## 2017-01-11 DIAGNOSIS — K219 Gastro-esophageal reflux disease without esophagitis: Secondary | ICD-10-CM | POA: Diagnosis not present

## 2017-01-11 DIAGNOSIS — Z7902 Long term (current) use of antithrombotics/antiplatelets: Secondary | ICD-10-CM | POA: Insufficient documentation

## 2017-01-11 DIAGNOSIS — I824Y2 Acute embolism and thrombosis of unspecified deep veins of left proximal lower extremity: Secondary | ICD-10-CM

## 2017-01-11 DIAGNOSIS — I739 Peripheral vascular disease, unspecified: Secondary | ICD-10-CM | POA: Diagnosis not present

## 2017-01-11 DIAGNOSIS — Z8546 Personal history of malignant neoplasm of prostate: Secondary | ICD-10-CM | POA: Diagnosis not present

## 2017-01-11 DIAGNOSIS — Z951 Presence of aortocoronary bypass graft: Secondary | ICD-10-CM | POA: Insufficient documentation

## 2017-01-11 HISTORY — PX: PERIPHERAL VASCULAR THROMBECTOMY: CATH118306

## 2017-01-11 HISTORY — PX: IVC FILTER INSERTION: CATH118245

## 2017-01-11 LAB — CREATININE, SERUM
CREATININE: 1.31 mg/dL — AB (ref 0.61–1.24)
GFR calc Af Amer: 58 mL/min — ABNORMAL LOW (ref >60)
GFR calc non Af Amer: 50 mL/min — ABNORMAL LOW (ref >60)

## 2017-01-11 LAB — BUN: BUN: 15 mg/dL (ref 6–20)

## 2017-01-11 LAB — GLUCOSE, CAPILLARY: Glucose-Capillary: 119 mg/dL — ABNORMAL HIGH (ref 65–99)

## 2017-01-11 SURGERY — PERIPHERAL VASCULAR THROMBECTOMY
Anesthesia: Moderate Sedation

## 2017-01-11 MED ORDER — ALTEPLASE 2 MG IJ SOLR
INTRAMUSCULAR | Status: AC
  Filled 2017-01-11: qty 12

## 2017-01-11 MED ORDER — HYDRALAZINE HCL 20 MG/ML IJ SOLN: 5.0000 mg | INTRAMUSCULAR | Status: DC | PRN

## 2017-01-11 MED ORDER — SODIUM CHLORIDE 0.9 % IV SOLN: 500.0000 mL | Freq: Once | INTRAVENOUS | Status: DC | PRN

## 2017-01-11 MED ORDER — ONDANSETRON HCL 4 MG/2ML IJ SOLN: 4.0000 mg | Freq: Four times a day (QID) | INTRAMUSCULAR | Status: DC | PRN

## 2017-01-11 MED ORDER — FENTANYL CITRATE (PF) 100 MCG/2ML IJ SOLN
INTRAMUSCULAR | Status: DC | PRN
  Administered 2017-01-11 (×3): 25 ug via INTRAVENOUS
  Administered 2017-01-11 (×2): 50 ug via INTRAVENOUS
  Administered 2017-01-11: 25 ug via INTRAVENOUS

## 2017-01-11 MED ORDER — FENTANYL CITRATE (PF) 100 MCG/2ML IJ SOLN
INTRAMUSCULAR | Status: AC
  Filled 2017-01-11: qty 2

## 2017-01-11 MED ORDER — CEFAZOLIN SODIUM-DEXTROSE 2-4 GM/100ML-% IV SOLN
INTRAVENOUS | Status: AC
  Filled 2017-01-11: qty 100

## 2017-01-11 MED ORDER — MIDAZOLAM HCL 5 MG/5ML IJ SOLN
INTRAMUSCULAR | Status: AC
  Filled 2017-01-11: qty 5

## 2017-01-11 MED ORDER — GUAIFENESIN-DM 100-10 MG/5ML PO SYRP: 15.0000 mL | ORAL_SOLUTION | ORAL | Status: DC | PRN

## 2017-01-11 MED ORDER — ACETAMINOPHEN 325 MG PO TABS
325.0000 mg | ORAL_TABLET | ORAL | Status: DC | PRN
Start: 2017-01-11 — End: 2017-01-11

## 2017-01-11 MED ORDER — ACETAMINOPHEN 325 MG RE SUPP
325.0000 mg | RECTAL | Status: DC | PRN
Start: 2017-01-11 — End: 2017-01-11

## 2017-01-11 MED ORDER — ALUM & MAG HYDROXIDE-SIMETH 200-200-20 MG/5ML PO SUSP: 15.0000 mL | ORAL | Status: DC | PRN

## 2017-01-11 MED ORDER — IOPAMIDOL (ISOVUE-300) INJECTION 61%
INTRAVENOUS | Status: DC | PRN
  Administered 2017-01-11: 65 mL via INTRA_ARTERIAL

## 2017-01-11 MED ORDER — FAMOTIDINE 20 MG PO TABS: 40.0000 mg | ORAL_TABLET | ORAL | Status: DC | PRN

## 2017-01-11 MED ORDER — METOPROLOL TARTRATE 5 MG/5ML IV SOLN: 2.0000 mg | INTRAVENOUS | Status: DC | PRN

## 2017-01-11 MED ORDER — MORPHINE SULFATE (PF) 4 MG/ML IV SOLN: 2.0000 mg | INTRAVENOUS | Status: DC | PRN

## 2017-01-11 MED ORDER — HYDROMORPHONE HCL 1 MG/ML IJ SOLN: 1.0000 mg | Freq: Once | INTRAMUSCULAR | Status: DC | PRN

## 2017-01-11 MED ORDER — OXYCODONE-ACETAMINOPHEN 5-325 MG PO TABS
ORAL_TABLET | ORAL | Status: AC
  Filled 2017-01-11: qty 2

## 2017-01-11 MED ORDER — LABETALOL HCL 5 MG/ML IV SOLN: 10.0000 mg | INTRAVENOUS | Status: DC | PRN

## 2017-01-11 MED ORDER — PHENOL 1.4 % MT LIQD
1.0000 | OROMUCOSAL | Status: DC | PRN
  Filled 2017-01-11: qty 177

## 2017-01-11 MED ORDER — OXYCODONE-ACETAMINOPHEN 5-325 MG PO TABS
1.0000 | ORAL_TABLET | ORAL | Status: DC | PRN
  Administered 2017-01-11: 2 via ORAL

## 2017-01-11 MED ORDER — HEPARIN SODIUM (PORCINE) 1000 UNIT/ML IJ SOLN
INTRAMUSCULAR | Status: DC | PRN
Start: 2017-01-11 — End: 2017-01-11
  Administered 2017-01-11: 3000 [IU] via INTRAVENOUS

## 2017-01-11 MED ORDER — LIDOCAINE-EPINEPHRINE (PF) 2 %-1:200000 IJ SOLN
INTRAMUSCULAR | Status: AC
  Filled 2017-01-11: qty 20

## 2017-01-11 MED ORDER — CEFAZOLIN SODIUM-DEXTROSE 1-4 GM/50ML-% IV SOLN: 1.0000 g | Freq: Once | INTRAVENOUS | Status: DC

## 2017-01-11 MED ORDER — HEPARIN SODIUM (PORCINE) 1000 UNIT/ML IJ SOLN
INTRAMUSCULAR | Status: AC
  Filled 2017-01-11: qty 1

## 2017-01-11 MED ORDER — ALTEPLASE 2 MG IJ SOLR
INTRAMUSCULAR | Status: DC | PRN
  Administered 2017-01-11: 12 mg

## 2017-01-11 MED ORDER — DEXTROSE 5 % IV SOLN
2000.0000 mg | Freq: Once | INTRAVENOUS | Status: AC
  Administered 2017-01-11: 2000 mg via INTRAVENOUS
  Filled 2017-01-11: qty 2000

## 2017-01-11 MED ORDER — SODIUM CHLORIDE 0.9 % IV SOLN
INTRAVENOUS | Status: DC
  Administered 2017-01-11: 09:00:00 via INTRAVENOUS

## 2017-01-11 MED ORDER — METHYLPREDNISOLONE SODIUM SUCC 125 MG IJ SOLR: 125.0000 mg | INTRAMUSCULAR | Status: DC | PRN

## 2017-01-11 MED ORDER — MIDAZOLAM HCL 2 MG/2ML IJ SOLN
INTRAMUSCULAR | Status: DC | PRN
  Administered 2017-01-11: 2 mg via INTRAVENOUS
  Administered 2017-01-11 (×3): 1 mg via INTRAVENOUS

## 2017-01-11 SURGICAL SUPPLY — 20 items
BALLN ULTRVRSE 10X60X75 (BALLOONS) ×4
BALLN ULTRVRSE 12X80X75 (BALLOONS) ×4
BALLOON ULTRVRSE 10X60X75 (BALLOONS) ×2 IMPLANT
BALLOON ULTRVRSE 12X80X75 (BALLOONS) ×2 IMPLANT
CANISTER PENUMBRA MAX (MISCELLANEOUS) ×4 IMPLANT
CANNULA 5F STIFF (CANNULA) ×4 IMPLANT
CATH BEACON 5 .035 65 KMP TIP (CATHETERS) ×3 IMPLANT
CATH INDIGO 8 XTORQ TIP 115CM (CATHETERS) ×4 IMPLANT
DEVICE PRESTO INFLATION (MISCELLANEOUS) ×4 IMPLANT
DRAPE BRACHIAL (DRAPES) ×4 IMPLANT
FILTER VC CELECT-FEMORAL (Filter) ×4 IMPLANT
GLIDEWIRE ADV .035X260CM (WIRE) ×4 IMPLANT
PACK ANGIOGRAPHY (CUSTOM PROCEDURE TRAY) ×4 IMPLANT
PREP CHG 10.5 TEAL (MISCELLANEOUS) ×4 IMPLANT
SHEATH BRITE TIP 8FRX11 (SHEATH) ×4 IMPLANT
STENT LIFESTAR 14X80X80 (Permanent Stent) ×4 IMPLANT
TOWEL OR 17X26 4PK STRL BLUE (TOWEL DISPOSABLE) ×4 IMPLANT
TUBING ASPIRATION INDIGO (MISCELLANEOUS) ×4 IMPLANT
WIRE J 3MM .035X145CM (WIRE) ×4 IMPLANT
WIRE MAGIC TOR.035 180C (WIRE) ×4 IMPLANT

## 2017-01-11 NOTE — Progress Notes (Signed)
Subjective:    Patient ID: Kenneth [name], male    DOB: 06/11/1941, 76 y.o.   MRN: [id number] Chief Complaint  Patient presents with  . Wound Check    Wound and unna check   Patient seen s/p ultrasound guidance for vascular access right femoral artery, catheter placement into left anterior tibial artery from right femoral approach, aortogram and selective left lower extremity angiogram, percutaneous transluminal angioplasty of left popliteal artery with 5 mm diameter by 15 cm length angioplasty balloon, viabahn stent placement to the left popliteal artery with 6 mm diameter by 15 cm length stent for greater than 50% residual stenosis after angioplasty with StarClose closure device right femoral artery on 12/31/16. Wife was calling office frantically yesterday asking for pain medication. States urologist only gave "mild medication". Asking patient to come in for STAT ABI - she refused. Presents today with chief complaint of LLE pain and swelling. Denies any rest pain or ulceration. Denies any fever, nausea or vomiting.    Review of Systems  Constitutional: Negative.   HENT: Negative.   Eyes: Negative.   Respiratory: Negative.   Cardiovascular: Positive for leg swelling.       Left Lower Extremity Pain  Gastrointestinal: Negative.   Endocrine: Negative.   Genitourinary: Negative.   Musculoskeletal: Negative.   Skin: Negative.   Allergic/Immunologic: Negative.   Neurological: Negative.   Hematological: Negative.   Psychiatric/Behavioral: Negative.       Objective:   Physical Exam  Constitutional: He is oriented to person, place, and time. He appears well-developed and well-nourished. No distress.  HENT:  Head: Normocephalic and atraumatic.  Eyes: Conjunctivae are normal. Pupils are equal, round, and reactive to light.  Neck: Normal range of motion.  Cardiovascular: Normal rate, regular rhythm and normal heart sounds.   Pulses:      Radial pulses are 2+ on the right side, and 2+  on the left side.  Hard to appreciate pedal pulses due to severity of swelling  Pulmonary/Chest: Effort normal.  Musculoskeletal: Normal range of motion. He exhibits edema (Moderate 2+ pitting edema to left lower extremity including thigh).  Neurological: He is alert and oriented to person, place, and time.  Skin: Skin is warm and dry. He is not diaphoretic.  Psychiatric: He has a normal mood and affect. His behavior is normal. Judgment and thought content normal.  Vitals reviewed.  BP (!) 157/61 (BP Location: Right Arm)   Pulse 62   Resp 17   Ht 5\' 9"  (1.753 m)   Wt 220 lb (99.8 kg)   BMI 32.49 kg/m   Past Medical History:  Diagnosis Date  . Adenomatous polyps   . Arthritis    right knee  . Carotid artery occlusion   . Coronary artery disease   . Coronary atherosclerosis of artery bypass graft   . Diabetes mellitus without complication ([place])   . Dysrhythmia   . GERD (gastroesophageal reflux disease)   . History of kidney stones   . Hyperlipidemia   . Hypertension   . Prostate cancer ([place])   . Prostate cancer [facility])    Social History   Social History  . Marital status: Married    Spouse name: N/A  . Number of children: N/A  . Years of education: N/A   Occupational History  . Not on file.   Social History Main Topics  . Smoking status: Current Every Day Smoker    Packs/day: 0.50    Years: 50.00    Types: Cigarettes  .  Smokeless tobacco: Never Used  . Alcohol use Yes     Comment: rare  . Drug use: No  . Sexual activity: Not on file   Other Topics Concern  . Not on file   Social History Narrative  . No narrative on file   Past Surgical History:  Procedure Laterality Date  . COLON RESECTION     polyps  . COLONOSCOPY WITH PROPOFOL N/A 06/24/2015   Procedure: COLONOSCOPY WITH PROPOFOL;  Surgeon: Lollie Sails, MD;  Location: [facility] ENDOSCOPY;  Service: Endoscopy;  Laterality: N/A;  . CORONARY ARTERY BYPASS GRAFT     1996 at [place] 2 vessels  . CYSTOSCOPY  W/ RETROGRADES Left 01/05/2017   Procedure: CYSTOSCOPY WITH RETROGRADE PYELOGRAM;  Surgeon: Royston Cowper, MD;  Location: [facility] ORS;  Service: Urology;  Laterality: Left;  . ENDARTERECTOMY Right 09/25/2015   Procedure: ENDARTERECTOMY CAROTID;  Surgeon: Algernon Huxley, MD;  Location: [facility] ORS;  Service: Vascular;  Laterality: Right;  . LITHOTRIPSY    . LOWER EXTREMITY ANGIOGRAPHY Left 12/31/2016   Procedure: Lower Extremity Angiography;  Surgeon: Algernon Huxley, MD;  Location: [place] [facility];  Service: Cardiovascular;  Laterality: Left;  . Removal of Testicles    . TRANSURETHRAL RESECTION OF BLADDER TUMOR  01/05/2017   Procedure: TRANSURETHRAL RESECTION OF URETHRAL TUMOR;  Surgeon: Royston Cowper, MD;  Location: [facility] ORS;  Service: Urology;;  . URETEROSCOPY N/A 01/05/2017   Procedure: URETEROSCOPY;  Surgeon: Royston Cowper, MD;  Location: [facility] ORS;  Service: Urology;  Laterality: N/A;   Family History  Problem Relation Age of Onset  . Family history unknown: Yes   Allergies  Allergen Reactions  . Percocet [Oxycodone-Acetaminophen] Rash    Skin peels      Assessment & Plan:  Patient seen s/p ultrasound guidance for vascular access right femoral artery, catheter placement into left anterior tibial artery from right femoral approach, aortogram and selective left lower extremity angiogram, percutaneous transluminal angioplasty of left popliteal artery with 5 mm diameter by 15 cm length angioplasty balloon, viabahn stent placement to the left popliteal artery with 6 mm diameter by 15 cm length stent for greater than 50% residual stenosis after angioplasty with StarClose closure device right femoral artery on 12/31/16. Wife was calling office frantically yesterday asking for pain medication. States urologist only gave "mild medication". Asking patient to come in for STAT ABI - she refused. Presents today with chief complaint of LLE pain and swelling. Denies any rest pain or ulceration. Denies any  fever, nausea or vomiting.   1. Pain and swelling of left lower extremity - Worsening Patient with stable significant edema to the LLE. Will order STAT DVT and will have ultrasonographer look at stent. RX'd Hydrocodone 5mg  one to two tabs ever six hours as needed for pain #60 Will wrap in unna wraps in an attempt to control swelling  Patient to elevate lower extremities.  Current Outpatient Prescriptions on File Prior to Visit  Medication Sig Dispense Refill  . aspirin 81 MG tablet Take 81 mg by mouth daily.    Marland Kitchen atenolol (TENORMIN) 25 MG tablet Take 25 mg by mouth daily.    Marland Kitchen docusate sodium (COLACE) 100 MG capsule Take 2 capsules (200 mg total) by mouth 2 (two) times daily. 120 capsule 3  . finasteride (PROSCAR) 5 MG tablet Take 5 mg by mouth daily.    Marland Kitchen glipiZIDE (GLUCOTROL) 5 MG tablet Take 5 mg by mouth daily before breakfast.     .  HYDROcodone-acetaminophen (NORCO/VICODIN) 5-325 MG tablet Take 1-2 tablets by mouth every 6 (six) hours as needed for moderate pain. 30 tablet 0  . levofloxacin (LEVAQUIN) 500 MG tablet Take 0.5 tablets (250 mg total) by mouth daily. 7 tablet 0  . lovastatin (MEVACOR) 20 MG tablet Take 20 mg by mouth daily.     . Methen-Hyosc-Meth Blue-Na Phos (UROGESIC-BLUE) 81.6 MG TABS Take 1 tablet (81.6 mg total) by mouth every 6 (six) hours as needed (dysuria). 40 tablet 3  . omeprazole (PRILOSEC) 20 MG capsule Take 20 mg by mouth every other day.    . ondansetron (ZOFRAN) 4 MG/2ML SOLN injection Inject 2 mLs (4 mg total) into the vein once as needed for nausea. (Patient not taking: Reported on 01/11/2017) 2 mL 0  . sitaGLIPtin (JANUVIA) 100 MG tablet Take 100 mg by mouth daily.    . tamsulosin (FLOMAX) 0.4 MG CAPS capsule Take 0.4 mg by mouth daily after supper.      No current facility-administered medications on file prior to visit.    There are no Patient Instructions on file for this visit. No Follow-up on file.  Dail Lerew A Zephyr Sausedo, PA-C

## 2017-01-11 NOTE — Op Note (Signed)
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 US guidance for vascular access to right femoral vein and left popliteal vein 2. Catheter placement into IVC from right femoral vein approach and left popliteal approach 3. IVC gram and LLE venogram 4. IVC filter placement 5.   Catheter directed thrombolysis with 12 mg of tpa to the left femoral vein and common and external iliac vein 6. Mechanical thrombectomy to left femoral vein and left external and common iliac veins with the penumbra cat 8 device 7. PTA of left common femoral vein with 10 mm balloon 8. PTA of left external and common iliac veins with 10 and 12 mm balloon 9.   Stent placement to the left external and common iliac veins with 14 mm diameter by 8 cm length self-expanding stent   SURGEON: Leotis Pain, MD  ASSISTANT(S): none  ANESTHESIA: local with moderate conscious sedation for 75 minutes using 5 mg of Versed and 200 mcg of Fentanyl  ESTIMATED BLOOD LOSS: 500 cc  FINDING(S): 1. Thrombus in the left common femoral vein and the left external iliac vein with occlusion of the proximal left external iliac vein and common iliac vein. The occlusion appeared like there was likely a chronic component with thrombosis behind the occlusion. The IVC was patent.  SPECIMEN(S): none  INDICATIONS:  Patient is a 76 y.o. male who presents with extensive left lower extremity iliofemoral DVT. Patient has marked leg swelling and pain. Venous intervention is performed to reduce the symtpoms and avoid long term postphlebitic symptoms.   DESCRIPTION: After obtaining full informed written consent, the patient was brought back to the vascular suite and placed supine upon the table.Moderate conscious sedation was administered during a face to face encounter with the  patient throughout the procedure with my supervision of the RN administering medicines and monitoring the patient's vital signs, pulse oximetry, telemetry and mental status throughout from the start of the procedure until the patient was taken to the recovery room. After obtaining adequate anesthesia, the patient was prepped and draped in the standard fashion. The right femoral vein was then accessed under US guidance and found to be widely patent. It was accessed without difficulty and a permanent image was recorded. I then placed the delivery sheath into the IVC. The IVC was patent and the renal veins were at the level of L1. The Cook Celect IVC filter was then deployed at the level of L2. The delivery sheath was then removed and dressings were placed in the right groin.  The patient was then placed into the prone position. The left popliteal vein was then accessed under direct ultrasound guidance without difficulty with a micropuncture needle and a permanent image was recorded. I then upsized to an 8Fr sheath over a J wire. 3000 units of heparin were then given. Imaging showed reasonably good flow up to the common femoral vein.There was thrombus in the left common femoral vein and the left external iliac vein with occlusion of the proximal left external iliac vein and common iliac vein. The occlusion appeared like there was likely a chronic component with thrombosis behind the occlusion. The IVC was patent. A Kumpe catheter and Advantage wire were then advanced into the CFV and images were performed which confirmed the above findings. I was able to cross the thrombus and stenosis and advance into the IVC which was patent. I then  used the Kumpe catheter and instilled 12 of tpa throughout the CFV and iliac veins.  After this dwelled for 15 minutes, I used the penumbra cat 8 catheter and evacuated about 200-250 cc of effluent with mechanical thrombectomy throughout the iliac veins and common femoral  vein. This had mild improvement. I then treated the CFV with 10 mm diameter by 6 cm length angioplasty balloon to open a channel. This resulted in some resolution of the thrombus and improved flow. I then turned my attention to the iliac veins. The stenosis/occlusion and thrombus was treated with a 10 mm diameter angioplasty balloon as well. This was slightly undersized and then upsized to a 12 mm diameter by 8 cm length angioplasty balloon for the external and common iliac veins. 2 inflations up to about 10 atm were performed. There was improvement but not resolution with high-grade residual narrowing in the 70-80% range at the junction of the external and common iliac veins with some thrombus above this and stenosis above and below this as well. Another pass with the penumbra cat 8 device was performed with some improvement in the thrombus but the stenosis persisted. The common femoral vein had less than 20% residual stenosis with minimal thrombus present. I elected to stent the iliac veins and a 14 mm diameter by 8 cm length self-expanding stent was deployed in the common and external iliac veins and postdilated with a 12 mm balloon. A final pass with the penumbra cat 8 catheter was performed in the iliac veins with significant improvement and only a mild amount of residual thrombus seen. There was only about a 15-20% residual stenosis after the stent placement.  The flow was much more brisk. I then elected to terminate the procedure. The sheath was removed and a dressing was placed. She was taken to the recovery room in stable condition having tolerated the procedure well.   COMPLICATIONS: None  CONDITION: Stable  Leotis Pain 01/11/2017 11:49 AM

## 2017-01-11 NOTE — H&P (Signed)
[facility] History & Physical Update  The patient was interviewed and re-examined.  The patient's previous History and Physical has been reviewed and is unchanged.  There is no change in the plan of care. We plan to proceed with the scheduled procedure.  Leotis Pain, MD  01/11/2017, 9:14 AM

## 2017-01-12 ENCOUNTER — Encounter: Payer: Self-pay | Admitting: Vascular Surgery

## 2017-01-12 LAB — SURGICAL PATHOLOGY

## 2017-01-14 ENCOUNTER — Ambulatory Visit (INDEPENDENT_AMBULATORY_CARE_PROVIDER_SITE_OTHER): Payer: Medicare Other | Admitting: Vascular Surgery

## 2017-01-14 ENCOUNTER — Encounter (INDEPENDENT_AMBULATORY_CARE_PROVIDER_SITE_OTHER): Payer: Self-pay

## 2017-01-14 VITALS — BP 130/77 | HR 73 | Resp 16

## 2017-01-14 DIAGNOSIS — M7989 Other specified soft tissue disorders: Secondary | ICD-10-CM

## 2017-01-14 DIAGNOSIS — L97909 Non-pressure chronic ulcer of unspecified part of unspecified lower leg with unspecified severity: Secondary | ICD-10-CM

## 2017-01-14 DIAGNOSIS — I83009 Varicose veins of unspecified lower extremity with ulcer of unspecified site: Secondary | ICD-10-CM | POA: Insufficient documentation

## 2017-01-14 NOTE — Progress Notes (Signed)
History of Present Illness  There is no documented history at this time  Assessments & Plan   There are no diagnoses linked to this encounter.    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    Procedure:  3 layer unna wrap was placed Left lower extremity.   Plan:   Follow up in one week.  

## 2017-01-19 ENCOUNTER — Ambulatory Visit (INDEPENDENT_AMBULATORY_CARE_PROVIDER_SITE_OTHER): Payer: Medicare Other | Admitting: Vascular Surgery

## 2017-01-19 ENCOUNTER — Ambulatory Visit (INDEPENDENT_AMBULATORY_CARE_PROVIDER_SITE_OTHER): Payer: Medicare Other

## 2017-01-19 ENCOUNTER — Other Ambulatory Visit (INDEPENDENT_AMBULATORY_CARE_PROVIDER_SITE_OTHER): Payer: Self-pay | Admitting: Vascular Surgery

## 2017-01-19 ENCOUNTER — Encounter (INDEPENDENT_AMBULATORY_CARE_PROVIDER_SITE_OTHER): Payer: Self-pay | Admitting: Vascular Surgery

## 2017-01-19 VITALS — BP 152/68 | HR 59 | Resp 16 | Ht 69.0 in | Wt 212.0 lb

## 2017-01-19 DIAGNOSIS — I739 Peripheral vascular disease, unspecified: Secondary | ICD-10-CM | POA: Diagnosis not present

## 2017-01-19 DIAGNOSIS — I82412 Acute embolism and thrombosis of left femoral vein: Secondary | ICD-10-CM

## 2017-01-19 DIAGNOSIS — E785 Hyperlipidemia, unspecified: Secondary | ICD-10-CM | POA: Diagnosis not present

## 2017-01-19 DIAGNOSIS — M7989 Other specified soft tissue disorders: Secondary | ICD-10-CM

## 2017-01-19 DIAGNOSIS — I6523 Occlusion and stenosis of bilateral carotid arteries: Secondary | ICD-10-CM

## 2017-01-19 DIAGNOSIS — M79605 Pain in left leg: Secondary | ICD-10-CM

## 2017-01-19 NOTE — Assessment & Plan Note (Signed)
His carotid duplex today reveals widely patent right carotid endarterectomy and stable stenosis in the mild less than 50% range on the left. Recheck in 1 year. Continue current medical regimen including aspirin and a statin agent in addition to his Eliquis

## 2017-01-19 NOTE — Assessment & Plan Note (Signed)
Some better after venous intervention but still prominent. Continue Unna boot wraps and elevation. Recent diagnosis of bladder and urethral cancer certainly complicates the situation and makes him more hypercoagulable

## 2017-01-19 NOTE — Assessment & Plan Note (Signed)
Improvement after recent intervention. Swelling is still noticeable. Continue Unna boot wraps. We will need to see him back in about 4-6 weeks with a repeat ultrasound. He is certainly more hypercoagulable because of his new bladder and urethral cancer diagnosis which appears to be a new tumor after history of bladder cancer. I suspect this was part of the cause of his iliofemoral DVT and he also clearly has lymphedema as a component of chronic scarring of the lymphatic channels. A difficult situation I suspect he will have long-term pain and swelling issues even with intervention. We will need to discuss getting out his filter after his next visit pending his ultrasound report.

## 2017-01-19 NOTE — Progress Notes (Signed)
MRN : [id number]  Kenneth [name] is a 76 y.o. ([birth year]) male who presents with chief complaint of  Chief Complaint  Patient presents with  . Carotid    Yearly ultrasound follow up  .  History of Present Illness: Patient returns today in follow up of Multiple vascular issues he has his yearly carotid ultrasound follow-up today. He is status post right carotid endarterectomy over a year ago. He has no new focal neurologic symptoms. Specifically, the patient denies amaurosis fugax, speech or swallowing difficulties, or arm or leg weakness or numbness. His carotid duplex today reveals widely patent right carotid endarterectomy and stable stenosis in the mild less than 50% range on the left. As for his left leg, he continues to have a fair bit of pain. The swelling has improved but is taking weekly Unna boot wraps to get this under control. Intervention for his left iliofemoral DVT was reasonably extensive and involve stent placement in the left iliac artery. He also had an IVC filter which will need to be removed at some point forward. He denies chest pain or shortness of breath. He has no fever or chills. 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 He has a previous history of bladder cancer. He has been referred to a urologist at [facility] as the local urologist was uncomfortable with this.  Current Outpatient Prescriptions  Medication Sig Dispense Refill  . apixaban (ELIQUIS) 5 MG TABS tablet Take 5 mg by mouth 2 (two) times daily.    Marland Kitchen aspirin 81 MG tablet Take 81 mg by mouth daily.    Marland Kitchen atenolol (TENORMIN) 25 MG tablet Take 25 mg by mouth daily.    Marland Kitchen docusate sodium (COLACE) 100 MG capsule Take 2 capsules (200 mg total) by mouth 2 (two) times daily. 120 capsule 3  . finasteride (PROSCAR) 5 MG tablet Take 5 mg by mouth daily.    Marland Kitchen glipiZIDE (GLUCOTROL) 5 MG tablet Take 5 mg by mouth daily before breakfast.     . HYDROcodone-acetaminophen (NORCO/VICODIN) 5-325 MG tablet Take  1-2 tablets by mouth every 6 (six) hours as needed for moderate pain. 30 tablet 0  . levofloxacin (LEVAQUIN) 500 MG tablet Take 0.5 tablets (250 mg total) by mouth daily. 7 tablet 0  . lovastatin (MEVACOR) 20 MG tablet Take 20 mg by mouth daily.     . Methen-Hyosc-Meth Blue-Na Phos (UROGESIC-BLUE) 81.6 MG TABS Take 1 tablet (81.6 mg total) by mouth every 6 (six) hours as needed (dysuria). 40 tablet 3  . omeprazole (PRILOSEC) 20 MG capsule Take 20 mg by mouth every other day.    . ondansetron (ZOFRAN) 4 MG/2ML SOLN injection Inject 2 mLs (4 mg total) into the vein once as needed for nausea. 2 mL 0  . sitaGLIPtin (JANUVIA) 100 MG tablet Take 100 mg by mouth daily.    . tamsulosin (FLOMAX) 0.4 MG CAPS capsule Take 0.4 mg by mouth daily after supper.      Current Facility-Administered Medications  Medication Dose Route Frequency Provider Last Rate Last Dose  . ceFAZolin (ANCEF) IVPB 1 g/50 mL premix  1 g Intravenous Once Algernon Huxley, MD            Past Medical History:  Diagnosis Date  . Adenomatous polyps   . Carotid artery occlusion   . Coronary atherosclerosis of artery bypass graft   . Diabetes mellitus without complication ([place])   . GERD (gastroesophageal reflux disease)   . Hyperlipidemia   .  Prostate cancer ([place])   . Prostate cancer [facility])          Past Surgical History:  Procedure Laterality Date  . COLON RESECTION    . COLONOSCOPY WITH PROPOFOL N/A 06/24/2015   Procedure: COLONOSCOPY WITH PROPOFOL; Surgeon: Lollie Sails, MD; Location: [facility] ENDOSCOPY; Service: Endoscopy; Laterality: N/A;  . CORONARY ARTERY BYPASS GRAFT     1996 at [place] 2 vessels  . ENDARTERECTOMY Right 09/25/2015   Procedure: ENDARTERECTOMY CAROTID; Surgeon: Algernon Huxley, MD; Location: [facility] ORS; Service: Vascular; Laterality: Right;  . Removal of Testicles      Social History        Social History  Substance Use Topics  . Smoking status: Current Every Day  Smoker    Packs/day: 15.00    Years: 50.00    Types: Cigarettes  . Smokeless tobacco: Never Used  . Alcohol use Yes      Comment: rare  Married.  Family History No family history of bleeding disorders, clotting disorders, autoimmune diseases, or aneurysms       Allergies  Allergen Reactions  . Percocet [Oxycodone-Acetaminophen]      REVIEW OF SYSTEMS(Negative unless checked)  Constitutional: [] Weight loss[] Fever[] Chills Cardiac:[] Chest pain[] Chest pressure[] Palpitations [] Shortness of breath when laying flat [] Shortness of breath at rest [x] Shortness of breath with exertion. Vascular: [] Pain in legs with walking[x] Pain in legsat rest[] Pain in legs when laying flat [x] Claudication [] Pain in feet when walking [] Pain in feet at rest [] Pain in feet when laying flat [x] History of DVT [] Phlebitis [x] Swelling in legs [] Varicose veins [] Non-healing ulcers Pulmonary: [] Uses home oxygen [] Productive cough[] Hemoptysis [] Wheeze [] COPD [] Asthma Neurologic: [] Dizziness [] Blackouts [] Seizures [] History of stroke [] History of TIA[] Aphasia [] Temporary blindness[] Dysphagia [] Weaknessor numbness in arms [] Weakness or numbnessin legs Musculoskeletal: [x] Arthritis [] Joint swelling [] Joint pain [] Low back pain Hematologic:[] Easy bruising[] Easy bleeding [] Hypercoagulable state [] Anemic [] Hepatitis Gastrointestinal:[] Blood in stool[] Vomiting blood[] Gastroesophageal reflux/heartburn[] Difficulty swallowing. Genitourinary: [] Chronic kidney disease [x] Difficulturination [x] Frequenturination [] Burning with urination[x] Blood in urine Skin: [] Rashes [] Ulcers [] Wounds Psychological: [] History of anxiety[] History of major depression.   Physical Examination  BP (!) 152/68 (BP Location: Left Arm)   Pulse (!) 59   Resp 16   Ht 5\' 9"  (1.753 m)   Wt 212 lb (96.2 kg)    BMI 31.31 kg/m  Gen:  WD/WN, NAD Head: [place]/AT, No temporalis wasting. Ear/Nose/Throat: Hearing grossly intact, nares w/o erythema or drainage, trachea midline Eyes: Conjunctiva clear. Sclera non-icteric Neck: Supple.  No JVD.  Pulmonary:  Good air movement, no use of accessory muscles.  Cardiac: RRR, normal S1, S2 Vascular:  Vessel Right Left  Radial Palpable Palpable          Carotid Palpable, without bruit Palpable, without bruit                       Gastrointestinal: soft, non-tender/non-distended.  Musculoskeletal: M/S 5/5 throughout.  No deformity or atrophy. No significant right lower extremity edema. 2+ left lower extremity edema. Neurologic: Sensation grossly intact in extremities.  Symmetrical.  Speech is fluent.  Psychiatric: Judgment intact, Mood & affect appropriate for pt's clinical situation. Dermatologic: No rashes or ulcers noted.  No cellulitis or open wounds.       Labs   Radiology US Renal  Result Date: 12/23/2016 CLINICAL DATA:  Hematuria EXAM: RENAL / URINARY TRACT ULTRASOUND COMPLETE COMPARISON:  CT abdomen and pelvis October 08, 2014 FINDINGS: Right Kidney: Length: 13.2 cm. Echogenicity and renal cortical thickness are within normal limits. No perinephric fluid or hydronephrosis visualized. There is a cyst in the mid right  kidney measuring 1.4 x 1.1 x 1.0 cm. No sonographically demonstrable calculus or ureterectasis. Left Kidney: Length: 13.3 cm. Echogenicity and renal cortical thickness are within normal limits. No perinephric fluid visualized. There is moderate pelvicaliectasis on the left. There is a cyst arising from the lower pole left kidney measuring 1.8 x 1.5 x 1.7 cm. No sonographically demonstrable calculus or ureterectasis. Bladder: Appears normal for degree of bladder distention. Flow from the right ureter is noted within the urinary bladder by color Doppler examination. No flow from the left side evident. IMPRESSION: Hydronephrosis on the left  without ureterectasis or demonstrable site of obstruction. No flow seen from the distal left ureter into the urinary bladder by color Doppler ultrasound. This finding may warrant noncontrast enhanced CT to assess for potential calculus in the left ureter. Small cysts in each kidney. Study otherwise unremarkable. These results will be called to the ordering clinician or representative by the Radiologist Assistant, and communication documented in the PACS or zVision Dashboard. Electronically Signed   By: Lowella Grip III M.D.   On: 12/23/2016 11:34     Assessment/Plan  Hyperlipidemia lipid control important in reducing the progression of atherosclerotic disease. Continue statin therapy  PAD (peripheral artery disease) (HCC) Status post recent revascularization of the left lower extremity. Arterial status is now normal. Still with pain and swelling likely from DVT and lymphedema.  Carotid stenosis His carotid duplex today reveals widely patent right carotid endarterectomy and stable stenosis in the mild less than 50% range on the left. Recheck in 1 year. Continue current medical regimen including aspirin and a statin agent in addition to his Eliquis  Pain and swelling of left lower extremity Some better after venous intervention but still prominent. Continue Unna boot wraps and elevation. Recent diagnosis of bladder and urethral cancer certainly complicates the situation and makes him more hypercoagulable  DVT femoral (deep venous thrombosis) with thrombophlebitis, left ([place]) Improvement after recent intervention. Swelling is still noticeable. Continue Unna boot wraps. We will need to see him back in about 4-6 weeks with a repeat ultrasound. He is certainly more hypercoagulable because of his new bladder and urethral cancer diagnosis which appears to be a new tumor after history of bladder cancer. I suspect this was part of the cause of his iliofemoral DVT and he also clearly has lymphedema as  a component of chronic scarring of the lymphatic channels. A difficult situation I suspect he will have long-term pain and swelling issues even with intervention. We will need to discuss getting out his filter after his next visit pending his ultrasound report.    Leotis Pain, MD  01/19/2017 10:45 AM    This note was created with Dragon medical transcription system.  Any errors from dictation are purely unintentional

## 2017-01-20 ENCOUNTER — Encounter: Payer: Self-pay | Admitting: Vascular Surgery

## 2017-01-21 ENCOUNTER — Ambulatory Visit (INDEPENDENT_AMBULATORY_CARE_PROVIDER_SITE_OTHER): Payer: Medicare Other | Admitting: Vascular Surgery

## 2017-01-21 ENCOUNTER — Encounter (INDEPENDENT_AMBULATORY_CARE_PROVIDER_SITE_OTHER): Payer: Self-pay

## 2017-01-21 VITALS — BP 123/57 | HR 72 | Resp 16 | Wt 209.6 lb

## 2017-01-21 DIAGNOSIS — M79605 Pain in left leg: Secondary | ICD-10-CM

## 2017-01-21 DIAGNOSIS — M7989 Other specified soft tissue disorders: Secondary | ICD-10-CM

## 2017-01-21 NOTE — Progress Notes (Signed)
History of Present Illness  There is no documented history at this time  Assessments & Plan   There are no diagnoses linked to this encounter.    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    Procedure:  3 layer unna wrap was placed Left lower extremity.   Plan:   Follow up in one week.  

## 2017-01-28 ENCOUNTER — Encounter (INDEPENDENT_AMBULATORY_CARE_PROVIDER_SITE_OTHER): Payer: Medicare Other

## 2017-01-29 ENCOUNTER — Encounter (INDEPENDENT_AMBULATORY_CARE_PROVIDER_SITE_OTHER): Payer: Self-pay

## 2017-01-29 ENCOUNTER — Encounter (INDEPENDENT_AMBULATORY_CARE_PROVIDER_SITE_OTHER): Payer: Medicare Other

## 2017-02-04 ENCOUNTER — Ambulatory Visit (INDEPENDENT_AMBULATORY_CARE_PROVIDER_SITE_OTHER): Payer: Medicare Other | Admitting: Vascular Surgery

## 2017-02-08 ENCOUNTER — Encounter (INDEPENDENT_AMBULATORY_CARE_PROVIDER_SITE_OTHER): Payer: Medicare Other

## 2017-02-08 ENCOUNTER — Ambulatory Visit (INDEPENDENT_AMBULATORY_CARE_PROVIDER_SITE_OTHER): Payer: Medicare Other | Admitting: Vascular Surgery

## 2017-02-09 ENCOUNTER — Emergency Department
Admission: EM | Admit: 2017-02-09 | Discharge: 2017-02-09 | Disposition: A | Discharge status: Home / Self Care | Payer: Medicare Other | Attending: Emergency Medicine | Admitting: Emergency Medicine

## 2017-02-09 ENCOUNTER — Emergency Department: Payer: Medicare Other

## 2017-02-09 DIAGNOSIS — G893 Neoplasm related pain (acute) (chronic): Secondary | ICD-10-CM | POA: Diagnosis not present

## 2017-02-09 DIAGNOSIS — F17210 Nicotine dependence, cigarettes, uncomplicated: Secondary | ICD-10-CM | POA: Insufficient documentation

## 2017-02-09 DIAGNOSIS — W19XXXA Unspecified fall, initial encounter: Secondary | ICD-10-CM | POA: Diagnosis not present

## 2017-02-09 DIAGNOSIS — S299XXA Unspecified injury of thorax, initial encounter: Secondary | ICD-10-CM | POA: Diagnosis present

## 2017-02-09 DIAGNOSIS — S2231XA Fracture of one rib, right side, initial encounter for closed fracture: Secondary | ICD-10-CM | POA: Insufficient documentation

## 2017-02-09 DIAGNOSIS — Z951 Presence of aortocoronary bypass graft: Secondary | ICD-10-CM | POA: Insufficient documentation

## 2017-02-09 DIAGNOSIS — Y939 Activity, unspecified: Secondary | ICD-10-CM | POA: Diagnosis not present

## 2017-02-09 DIAGNOSIS — Z8546 Personal history of malignant neoplasm of prostate: Secondary | ICD-10-CM | POA: Diagnosis not present

## 2017-02-09 DIAGNOSIS — E119 Type 2 diabetes mellitus without complications: Secondary | ICD-10-CM | POA: Insufficient documentation

## 2017-02-09 DIAGNOSIS — C799 Secondary malignant neoplasm of unspecified site: Secondary | ICD-10-CM | POA: Diagnosis not present

## 2017-02-09 DIAGNOSIS — Y999 Unspecified external cause status: Secondary | ICD-10-CM | POA: Insufficient documentation

## 2017-02-09 DIAGNOSIS — R1010 Upper abdominal pain, unspecified: Secondary | ICD-10-CM | POA: Insufficient documentation

## 2017-02-09 DIAGNOSIS — I2510 Atherosclerotic heart disease of native coronary artery without angina pectoris: Secondary | ICD-10-CM | POA: Insufficient documentation

## 2017-02-09 DIAGNOSIS — Y929 Unspecified place or not applicable: Secondary | ICD-10-CM | POA: Diagnosis not present

## 2017-02-09 LAB — COMPREHENSIVE METABOLIC PANEL
ALBUMIN: 4.1 g/dL (ref 3.5–5.0)
ALT: 12 U/L — ABNORMAL LOW (ref 17–63)
ANION GAP: 8 (ref 5–15)
AST: 22 U/L (ref 15–41)
Alkaline Phosphatase: 65 U/L (ref 38–126)
BUN: 23 mg/dL — ABNORMAL HIGH (ref 6–20)
CO2: 24 mmol/L (ref 22–32)
Calcium: 9.3 mg/dL (ref 8.9–10.3)
Chloride: 101 mmol/L (ref 101–111)
Creatinine, Ser: 1.33 mg/dL — ABNORMAL HIGH (ref 0.61–1.24)
GFR calc non Af Amer: 50 mL/min — ABNORMAL LOW (ref >60)
GFR, EST AFRICAN AMERICAN: 57 mL/min — AB (ref >60)
GLUCOSE: 211 mg/dL — AB (ref 65–99)
POTASSIUM: 4.6 mmol/L (ref 3.5–5.1)
Sodium: 133 mmol/L — ABNORMAL LOW (ref 135–145)
Total Bilirubin: 0.5 mg/dL (ref 0.3–1.2)
Total Protein: 6.9 g/dL (ref 6.5–8.1)

## 2017-02-09 LAB — CBC WITH DIFFERENTIAL/PLATELET
BASOS PCT: 1 %
Basophils Absolute: 0 10*3/uL (ref 0–0.1)
EOS ABS: 0.1 10*3/uL (ref 0–0.7)
Eosinophils Relative: 1 %
HEMATOCRIT: 36.8 % — AB (ref 40.0–52.0)
Hemoglobin: 12.4 g/dL — ABNORMAL LOW (ref 13.0–18.0)
Lymphocytes Relative: 18 %
Lymphs Abs: 1.6 10*3/uL (ref 1.0–3.6)
MCH: 28.7 pg (ref 26.0–34.0)
MCHC: 33.6 g/dL (ref 32.0–36.0)
MCV: 85.6 fL (ref 80.0–100.0)
MONO ABS: 1 10*3/uL (ref 0.2–1.0)
MONOS PCT: 11 %
NEUTROS ABS: 6.1 10*3/uL (ref 1.4–6.5)
Neutrophils Relative %: 69 %
Platelets: 176 10*3/uL (ref 150–440)
RBC: 4.3 MIL/uL — ABNORMAL LOW (ref 4.40–5.90)
RDW: 14 % (ref 11.5–14.5)
WBC: 8.8 10*3/uL (ref 3.8–10.6)

## 2017-02-09 LAB — TROPONIN I

## 2017-02-09 LAB — LIPASE, BLOOD: LIPASE: 21 U/L (ref 11–51)

## 2017-02-09 MED ORDER — HYDROMORPHONE HCL 1 MG/ML IJ SOLN
0.5000 mg | Freq: Once | INTRAMUSCULAR | Status: AC
  Administered 2017-02-09: 0.5 mg via INTRAVENOUS

## 2017-02-09 MED ORDER — HYDROCODONE-ACETAMINOPHEN 7.5-300 MG PO TABS: 1.0000 | ORAL_TABLET | Freq: Four times a day (QID) | ORAL | 0 refills | Status: DC | PRN

## 2017-02-09 MED ORDER — HYDROMORPHONE HCL 1 MG/ML IJ SOLN
INTRAMUSCULAR | Status: AC
  Filled 2017-02-09: qty 1

## 2017-02-09 NOTE — ED Provider Notes (Signed)
[facility] Emergency Department Provider Note       Time seen: ----------------------------------------- 3:51 PM on 02/09/2017 -----------------------------------------     I have reviewed the triage vital signs and the nursing notes.   HISTORY   Chief Complaint Fall and Flank Pain    HPI Kenneth [name] is a 76 y.o. male who presents to the ED for a fall last night that caused him to have some right-sided rib pain. Patient states his left leg gave out on him and he fell striking his right side of the table. 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 Patient states he was diagnosed with a rare type of cancer that has no treatment.   Past Medical History:  Diagnosis Date  . Adenomatous polyps   . Arthritis    right knee  . Carotid artery occlusion   . Coronary artery disease   . Coronary atherosclerosis of artery bypass graft   . Diabetes mellitus without complication ([place])   . Dysrhythmia   . GERD (gastroesophageal reflux disease)   . History of kidney stones   . Hyperlipidemia   . Hypertension   . Prostate cancer ([place])   . Prostate cancer [facility])     Patient Active Problem List   Diagnosis Date Noted  . Venous ulcer ([place]) 01/14/2017  . PAD (peripheral artery disease) ([place]) 01/08/2017  . DVT femoral (deep venous thrombosis) with thrombophlebitis, left ([place]) 01/08/2017  . Pain and swelling of left lower extremity 12/08/2016  . Carotid stenosis 09/25/2015  . Pre-operative cardiovascular examination 09/06/2015  . Hyperlipidemia     Past Surgical History:  Procedure Laterality Date  . COLON RESECTION     polyps  . COLONOSCOPY WITH PROPOFOL N/A 06/24/2015   Procedure: COLONOSCOPY WITH PROPOFOL;  Surgeon: Lollie Sails, MD;  Location: [facility] ENDOSCOPY;  Service: Endoscopy;  Laterality: N/A;  . CORONARY ARTERY BYPASS GRAFT     1996 at [place] 2 vessels   . CYSTOSCOPY W/ RETROGRADES Left 01/05/2017   Procedure: CYSTOSCOPY WITH RETROGRADE PYELOGRAM;  Surgeon: Royston Cowper, MD;  Location: [facility] ORS;  Service: Urology;  Laterality: Left;  . ENDARTERECTOMY Right 09/25/2015   Procedure: ENDARTERECTOMY CAROTID;  Surgeon: Algernon Huxley, MD;  Location: [facility] ORS;  Service: Vascular;  Laterality: Right;  . IVC FILTER INSERTION N/A 01/11/2017   Procedure: IVC Filter Insertion;  Surgeon: Algernon Huxley, MD;  Location: [place] CV LAB;  Service: Cardiovascular;  Laterality: N/A;  . LITHOTRIPSY    . LOWER EXTREMITY ANGIOGRAPHY Left 12/31/2016   Procedure: Lower Extremity Angiography;  Surgeon: Algernon Huxley, MD;  Location: [place] CV LAB;  Service: Cardiovascular;  Laterality: Left;  . PERIPHERAL VASCULAR THROMBECTOMY Left 01/11/2017   Procedure: Peripheral Vascular Thrombectomy;  Surgeon: Algernon Huxley, MD;  Location: [place] CV LAB;  Service: Cardiovascular;  Laterality: Left;  . Removal of Testicles    . TRANSURETHRAL RESECTION OF BLADDER TUMOR  01/05/2017   Procedure: TRANSURETHRAL RESECTION OF URETHRAL TUMOR;  Surgeon: Royston Cowper, MD;  Location: [facility] ORS;  Service: Urology;;  . URETEROSCOPY N/A 01/05/2017   Procedure: URETEROSCOPY;  Surgeon: Royston Cowper, MD;  Location: [facility] ORS;  Service: Urology;  Laterality: N/A;    Allergies Percocet [oxycodone-acetaminophen]  Social History Social History  Substance Use Topics  . Smoking status: Current Every Day Smoker    Packs/day: 0.50    Years: 50.00  Types: Cigarettes  . Smokeless tobacco: Never Used  . Alcohol use Yes     Comment: rare    Review of Systems Constitutional: Negative for fever. Eyes: Negative for vision changes ENT:  Negative for congestion, sore throat Cardiovascular: Negative for chest pain. Respiratory: Negative for shortness of breath. Gastrointestinal: Positive for abdominal pain Genitourinary: Negative for dysuria. Musculoskeletal: Positive for left  leg pain Skin: Negative for rash. Neurological: Negative for headaches, focal weakness or numbness.  All systems negative/normal/unremarkable except as stated in the HPI  ____________________________________________   PHYSICAL EXAM:  VITAL SIGNS: ED Triage Vitals  Enc Vitals Group     BP 02/09/17 1528 (!) 141/56     Pulse Rate 02/09/17 1528 65     Resp 02/09/17 1528 16     Temp 02/09/17 1528 97.6 F (36.4 C)     Temp Source 02/09/17 1528 Oral     SpO2 02/09/17 1528 96 %     Weight 02/09/17 1524 200 lb (90.7 kg)     Height 02/09/17 1524 5\' 9"  (1.753 m)     Head Circumference --      Peak Flow --      Pain Score 02/09/17 1524 7     Pain Loc --      Pain Edu? --      Excl. in [place]? --     Constitutional: Alert and oriented. Mild distress Eyes: Conjunctivae are normal. Normal extraocular movements. ENT   Head: Normocephalic and atraumatic.   Nose: No congestion/rhinnorhea.   Mouth/Throat: Mucous membranes are moist.   Neck: No stridor. Cardiovascular: Normal rate, regular rhythm. No murmurs, rubs, or gallops.Good distal pulses in the extremities Respiratory: Normal respiratory effort without tachypnea nor retractions. Breath sounds are clear and equal bilaterally. No wheezes/rales/rhonchi. Gastrointestinal: Diffuse, nonfocal tenderness. Normal bowel sounds. Musculoskeletal: Left leg swelling and nonfocal tenderness.  Neurologic:  Normal speech and language. No gross focal neurologic deficits are appreciated.  Skin:  Skin is warm, dry and intact. No rash noted. Psychiatric: Mood and affect are normal. Speech and behavior are normal.  ____________________________________________  ED COURSE:  Pertinent labs & imaging results that were available during my care of the patient were reviewed by me and considered in my medical decision making (see chart for details). Patient presents for diffuse pain and recent fall, we will assess with labs and imaging as indicated.    Procedures ____________________________________________   LABS (pertinent positives/negatives)  Labs Reviewed  CBC WITH DIFFERENTIAL/PLATELET - Abnormal; Notable for the following:       Result Value   RBC 4.30 (*)    Hemoglobin 12.4 (*)    HCT 36.8 (*)    All other components within normal limits  COMPREHENSIVE METABOLIC PANEL - Abnormal; Notable for the following:    Sodium 133 (*)    Glucose, Bld 211 (*)    BUN 23 (*)    Creatinine, Ser 1.33 (*)    ALT 12 (*)    GFR calc non Af Amer 50 (*)    GFR calc Af Amer 57 (*)    All other components within normal limits  LIPASE, BLOOD  TROPONIN I  URINALYSIS, COMPLETE (UACMP) WITH MICROSCOPIC    RADIOLOGY  Right rib x-rays IMPRESSION: 1. Cannot exclude subtle fracture of the anterior right seventh rib. 2. No active lung disease. Bibasilar linear atelectasis or scarring. 3. Stable cardiomegaly ____________________________________________  FINAL ASSESSMENT AND PLAN  Metastatic cancer, fall, right rib pain with possible right rib fracture  Plan:  Patient's labs and imaging were dictated above. Patient had presented for a fall that occurred at home. Patient also has pain due to recently diagnosed metastatic cancer. I will prescribe pain medicine and have advised close outpatient follow-up with his primary care doctor.   Earleen Newport, MD   Note: This note was generated in part or whole with voice recognition software. Voice recognition is usually quite accurate but there are transcription errors that can and very often do occur. I apologize for any typographical errors that were not detected and corrected.     Earleen Newport, MD 02/09/17 [phone]

## 2017-02-09 NOTE — ED Notes (Signed)
Pt wife called will be coming to pick up pt and be here inaprox 10-83min. Charge RN notified and pt will remain in room until ride is here

## 2017-02-09 NOTE — ED Triage Notes (Addendum)
Pt arrived via ems for report of fall last night that caused him to hit his right side on a table and now he is having tender/pressure pain in that area - pt pt states the real reason that he is here is for his cancer pain - he states that he was dx with cancer in April but he does not know what kind of cancer he has - he states that he is unable to sleep and the pain is more than he can take anymore - he has noted swelling to the left leg and knee area that he states is from the cancer - pt is on eliquis

## 2017-02-11 DIAGNOSIS — C61 Malignant neoplasm of prostate: Secondary | ICD-10-CM | POA: Insufficient documentation

## 2017-02-11 NOTE — Progress Notes (Signed)
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  INTERVAL HISTORY: Patient is a 76 year old male who was recently diagnosed with the above stated malignancy. His entire workup and initial evaluation was done at [facility]. He is referred for further evaluation and discussion of treatment options. Currently he is highly anxious. He also continues to have significant pain particularly in his pelvis. He has persistent lymphedema of his left leg. He has no neurologic complaints. He denies any recent fevers. He denies any chest pain, shortness of breath, cough, or hemoptysis. He has a fair appetite and denies any nausea, vomiting, constipation, or diarrhea. Patient offers no further specific complaints today.  REVIEW OF SYSTEMS:   Review of Systems  Constitutional: Negative.  Negative for fever, malaise/fatigue and weight loss.  Respiratory: Negative.  Negative for cough and shortness of breath.   Cardiovascular: Positive for leg swelling. Negative for chest pain.  Gastrointestinal: Positive for abdominal pain. Negative for blood in stool, constipation and melena.  Genitourinary: Negative.   Musculoskeletal: Negative.   Skin: Negative.  Negative for rash.  Neurological: Negative for sensory change and weakness.  Psychiatric/Behavioral: The patient is nervous/anxious.     As per HPI. Otherwise, a complete review of systems is negative.  PAST MEDICAL HISTORY: Past Medical History:  Diagnosis Date  . Adenomatous polyps   . Arthritis    right knee  . Carotid artery occlusion   . Coronary artery disease   . Coronary atherosclerosis of artery bypass graft   . Diabetes mellitus without complication ([place])   .  Dysrhythmia   . GERD (gastroesophageal reflux disease)   . History of kidney stones   . Hyperlipidemia   . Hypertension   . Prostate cancer ([place])   . Prostate cancer ([place])     PAST SURGICAL HISTORY: Past Surgical History:  Procedure Laterality Date  . COLON RESECTION     polyps  . COLONOSCOPY WITH PROPOFOL N/A 06/24/2015   Procedure: COLONOSCOPY WITH PROPOFOL;  Surgeon: Lollie Sails, MD;  Location: [facility] ENDOSCOPY;  Service: Endoscopy;  Laterality: N/A;  . CORONARY ARTERY BYPASS GRAFT     1996 at [place] 2 vessels  . CYSTOSCOPY W/ RETROGRADES Left 01/05/2017   Procedure: CYSTOSCOPY WITH RETROGRADE PYELOGRAM;  Surgeon: Royston Cowper, MD;  Location: [facility] ORS;  Service: Urology;  Laterality: Left;  . ENDARTERECTOMY Right 09/25/2015   Procedure: ENDARTERECTOMY CAROTID;  Surgeon: Algernon Huxley, MD;  Location: [facility] ORS;  Service: Vascular;  Laterality: Right;  . IVC FILTER INSERTION N/A 01/11/2017   Procedure: IVC Filter Insertion;  Surgeon: Algernon Huxley, MD;  Location: [place] CV LAB;  Service: Cardiovascular;  Laterality: N/A;  . LITHOTRIPSY    . LOWER EXTREMITY ANGIOGRAPHY Left 12/31/2016   Procedure: Lower Extremity Angiography;  Surgeon: Algernon Huxley, MD;  Location: [place] CV LAB;  Service: Cardiovascular;  Laterality: Left;  . PERIPHERAL VASCULAR THROMBECTOMY Left 01/11/2017   Procedure: Peripheral Vascular Thrombectomy;  Surgeon: Algernon Huxley, MD;  Location: [place] CV LAB;  Service: Cardiovascular;  Laterality: Left;  . Removal of Testicles    . TRANSURETHRAL RESECTION OF BLADDER TUMOR  01/05/2017   Procedure: TRANSURETHRAL RESECTION OF URETHRAL TUMOR;  Surgeon: Royston Cowper, MD;  Location: [facility]  ORS;  Service: Urology;;  . URETEROSCOPY N/A 01/05/2017   Procedure: URETEROSCOPY;  Surgeon: Royston Cowper, MD;  Location: [facility] ORS;  Service: Urology;  Laterality: N/A;    FAMILY HISTORY: Family History  Problem Relation Age of Onset  . Family history unknown: Yes     ADVANCED DIRECTIVES (Y/N):  N  HEALTH MAINTENANCE: Social History  Substance Use Topics  . Smoking status: Current Every Day Smoker    Packs/day: 0.50    Years: 50.00    Types: Cigarettes  . Smokeless tobacco: Never Used  . Alcohol use Yes     Comment: rare     Colonoscopy:  PAP:  Bone density:  Lipid panel:  Allergies  Allergen Reactions  . Percocet [Oxycodone-Acetaminophen] Rash    Skin peels    Current Outpatient Prescriptions  Medication Sig Dispense Refill  . apixaban (ELIQUIS) 5 MG TABS tablet Take 5 mg by mouth 2 (two) times daily.    Marland Kitchen aspirin 81 MG tablet Take 81 mg by mouth daily.    Marland Kitchen atenolol (TENORMIN) 25 MG tablet Take 25 mg by mouth daily.    Marland Kitchen docusate sodium (COLACE) 100 MG capsule Take 2 capsules (200 mg total) by mouth 2 (two) times daily. 120 capsule 3  . finasteride (PROSCAR) 5 MG tablet Take 5 mg by mouth daily.    Marland Kitchen glipiZIDE (GLUCOTROL) 5 MG tablet Take 5 mg by mouth daily before breakfast.     . lovastatin (MEVACOR) 20 MG tablet Take 20 mg by mouth daily.     Marland Kitchen omeprazole (PRILOSEC) 20 MG capsule Take 20 mg by mouth every other day.    . sitaGLIPtin (JANUVIA) 100 MG tablet Take 100 mg by mouth daily.    . tamsulosin (FLOMAX) 0.4 MG CAPS capsule Take 0.4 mg by mouth daily after supper.     . fentaNYL (DURAGESIC - DOSED MCG/HR) 25 MCG/HR patch Place 1 patch (25 mcg total) onto the skin every 3 (three) days. 10 patch 0  . HYDROcodone-acetaminophen (NORCO/VICODIN) 5-325 MG tablet Take 1 tablet by mouth every 6 (six) hours as needed for moderate pain. 60 tablet 0  . LORazepam (ATIVAN) 1 MG tablet Take 1 mg by mouth every 8 (eight) hours.    . sertraline (ZOLOFT) 50 MG tablet Take 50 mg by mouth daily.     Current Facility-Administered Medications  Medication Dose Route Frequency Provider Last Rate Last Dose  . ceFAZolin (ANCEF) IVPB 1 g/50 mL premix  1 g Intravenous Once Algernon Huxley, MD        OBJECTIVE: Vitals:   02/12/17 0953  BP: (!)  172/51  Pulse: (!) 54  Temp: 97.3 F (36.3 C)     Body mass index is 29.82 kg/m.    ECOG FS:1 - Symptomatic but completely ambulatory  General: Well-developed, well-nourished, no acute distress. Eyes: Pink conjunctiva, anicteric sclera. HEENT: Normocephalic, moist mucous membranes, clear oropharnyx. Lungs: Clear to auscultation bilaterally. Heart: Regular rate and rhythm. No rubs, murmurs, or gallops. Abdomen: Soft, nontender, nondistended. No organomegaly noted, normoactive bowel sounds. Musculoskeletal: Left leg lymphedema Neuro: Alert, answering all questions appropriately. Cranial nerves grossly intact. Skin: No rashes or petechiae noted. Psych: Normal affect.  LAB RESULTS:  Lab Results  Component Value Date   NA 133 (L) 02/09/2017   K 4.6 02/09/2017   CL 101 02/09/2017   CO2 24 02/09/2017   GLUCOSE 211 (H) 02/09/2017   BUN 23 (H) 02/09/2017   CREATININE 1.33 (H) 02/09/2017   CALCIUM 9.3  02/09/2017   PROT 6.9 02/09/2017   ALBUMIN 4.1 02/09/2017   AST 22 02/09/2017   ALT 12 (L) 02/09/2017   ALKPHOS 65 02/09/2017   BILITOT 0.5 02/09/2017   GFRNONAA 50 (L) 02/09/2017   GFRAA 57 (L) 02/09/2017    Lab Results  Component Value Date   WBC 8.8 02/09/2017   NEUTROABS 6.1 02/09/2017   HGB 12.4 (L) 02/09/2017   HCT 36.8 (L) 02/09/2017   MCV 85.6 02/09/2017   PLT 176 02/09/2017     STUDIES: Dg Ribs Unilateral W/chest Right  Result Date: 02/09/2017 CLINICAL DATA:  [place] last night, now with right lower anterior rib pain, history of prostate carcinoma, smoking history EXAM: RIGHT RIBS AND CHEST - 3+ VIEW COMPARISON:  None. FINDINGS: Scarring or atelectasis is present in the lung bases. No pneumonia or effusion is seen. Focal pleural thickening is present in the left lung apex. Mediastinal and hilar contours are unremarkable. The heart is mildly enlarged. Right rib detail films show what may be a subtle fracture of the anterior right seventh rib but no other definite bony  abnormality is seen. Median sternotomy sutures are noted from prior CABG. IMPRESSION: 1. Cannot exclude subtle fracture of the anterior right seventh rib. 2. No active lung disease. Bibasilar linear atelectasis or scarring. 3. Stable cardiomegaly Electronically Signed   By: Ivar Drape M.D.   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  PLAN:    1. 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 After lengthy discussion with the patient, he again declined systemic chemotherapy using a platinum based regimen along with etoposide. He expressed understanding that his treatment options are limited. He also declined palliative XRT to his pelvic area for his persistent pain. Hospice and end-of-life care also discussed, but patient is not ready for this today. Both he and his wife agreed that they will initiate hospice in the future when necessary. No intervention is needed at this time. Will only get additional imaging if patient decides to pursue treatment. Return to clinic in 1 month for further evaluation. 2. Pain: Patient declined XRT as above. He was given a prescription for fentanyl 25 g every 72 hours as well as hydrocodone for breakthrough pain. 3. Anxiety: Continue lorazepam as prescribed.  Approximately 60 minutes was spent in discussion of which greater than 50% was consultation.  Patient expressed understanding and was in agreement with this plan. He also understands that He can call clinic at any time with any questions, concerns, or complaints.   Cancer Staging No matching staging information was found for the patient.  Lloyd Huger, MD   02/12/2017 11:08 AM

## 2017-02-12 ENCOUNTER — Inpatient Hospital Stay: Payer: Medicare Other | Attending: Oncology | Admitting: Oncology

## 2017-02-12 DIAGNOSIS — F17210 Nicotine dependence, cigarettes, uncomplicated: Secondary | ICD-10-CM | POA: Insufficient documentation

## 2017-02-12 DIAGNOSIS — C7800 Secondary malignant neoplasm of unspecified lung: Secondary | ICD-10-CM | POA: Diagnosis not present

## 2017-02-12 DIAGNOSIS — E119 Type 2 diabetes mellitus without complications: Secondary | ICD-10-CM | POA: Diagnosis not present

## 2017-02-12 DIAGNOSIS — C7A8 Other malignant neuroendocrine tumors: Secondary | ICD-10-CM | POA: Diagnosis not present

## 2017-02-12 DIAGNOSIS — I10 Essential (primary) hypertension: Secondary | ICD-10-CM | POA: Diagnosis not present

## 2017-02-12 DIAGNOSIS — Z79899 Other long term (current) drug therapy: Secondary | ICD-10-CM

## 2017-02-12 DIAGNOSIS — Z7984 Long term (current) use of oral hypoglycemic drugs: Secondary | ICD-10-CM | POA: Insufficient documentation

## 2017-02-12 DIAGNOSIS — I25810 Atherosclerosis of coronary artery bypass graft(s) without angina pectoris: Secondary | ICD-10-CM | POA: Insufficient documentation

## 2017-02-12 DIAGNOSIS — F419 Anxiety disorder, unspecified: Secondary | ICD-10-CM | POA: Diagnosis not present

## 2017-02-12 DIAGNOSIS — C61 Malignant neoplasm of prostate: Secondary | ICD-10-CM

## 2017-02-12 DIAGNOSIS — E785 Hyperlipidemia, unspecified: Secondary | ICD-10-CM | POA: Diagnosis not present

## 2017-02-12 DIAGNOSIS — C787 Secondary malignant neoplasm of liver and intrahepatic bile duct: Secondary | ICD-10-CM | POA: Diagnosis not present

## 2017-02-12 DIAGNOSIS — Z87442 Personal history of urinary calculi: Secondary | ICD-10-CM | POA: Diagnosis not present

## 2017-02-12 DIAGNOSIS — Z7982 Long term (current) use of aspirin: Secondary | ICD-10-CM | POA: Insufficient documentation

## 2017-02-12 DIAGNOSIS — I2510 Atherosclerotic heart disease of native coronary artery without angina pectoris: Secondary | ICD-10-CM | POA: Insufficient documentation

## 2017-02-12 DIAGNOSIS — K219 Gastro-esophageal reflux disease without esophagitis: Secondary | ICD-10-CM | POA: Insufficient documentation

## 2017-02-12 DIAGNOSIS — I890 Lymphedema, not elsewhere classified: Secondary | ICD-10-CM | POA: Insufficient documentation

## 2017-02-12 MED ORDER — FENTANYL 25 MCG/HR TD PT72: 25.0000 ug | MEDICATED_PATCH | TRANSDERMAL | 0 refills | Status: DC

## 2017-02-12 MED ORDER — HYDROCODONE-ACETAMINOPHEN 5-325 MG PO TABS: 1.0000 | ORAL_TABLET | Freq: Four times a day (QID) | ORAL | 0 refills | Status: DC | PRN

## 2017-02-12 NOTE — Progress Notes (Signed)
Patient here today as a new patient  

## 2017-02-15 ENCOUNTER — Telehealth: Payer: Self-pay | Admitting: *Deleted

## 2017-02-15 NOTE — Telephone Encounter (Signed)
Called to request referral if in agreement for hospice services per request of his Wife [name]. Please advise

## 2017-02-16 NOTE — Telephone Encounter (Signed)
Referral had to be re faxed to hospice

## 2017-02-17 ENCOUNTER — Encounter: Payer: Self-pay | Admitting: Internal Medicine

## 2017-02-19 ENCOUNTER — Telehealth: Payer: Self-pay | Admitting: *Deleted

## 2017-02-19 ENCOUNTER — Ambulatory Visit: Payer: Medicare Other | Admitting: Oncology

## 2017-02-19 NOTE — Telephone Encounter (Signed)
Lattie Haw informed and she is concerned that over the weekend the patient will not be able to void at all. She is going to call [place] to speak with team

## 2017-02-19 NOTE — Telephone Encounter (Signed)
Have hospice evaluate and get a u/a and culture

## 2017-02-19 NOTE — Telephone Encounter (Signed)
Called to report that patient reports he is having difficulty passing urine, having to strain and he has noticed that he has some blood in urine stream also. Please advise

## 2017-02-22 ENCOUNTER — Ambulatory Visit: Payer: Self-pay | Admitting: Urology

## 2017-02-22 ENCOUNTER — Encounter: Payer: Self-pay | Admitting: Urology

## 2017-02-22 ENCOUNTER — Telehealth: Payer: Self-pay | Admitting: *Deleted

## 2017-02-22 VITALS — BP 155/65 | HR 76 | Ht 69.0 in | Wt 202.0 lb

## 2017-02-22 DIAGNOSIS — R339 Retention of urine, unspecified: Secondary | ICD-10-CM | POA: Diagnosis not present

## 2017-02-22 DIAGNOSIS — N3946 Mixed incontinence: Secondary | ICD-10-CM

## 2017-02-22 LAB — BLADDER SCAN AMB NON-IMAGING

## 2017-02-22 NOTE — Progress Notes (Signed)
02/22/2017 10:30 AM   Kenneth [name] 1941/01/10 [id number]  Referring provider: Albina Billet, MD [address]   [place], [place] [address]  Chief Complaint  Patient presents with  . Urinary Retention    New Patient    HPI: The patient has metastatic prostate cancer. He has been followed at [facility]. He has lymphedema and pain in the pelvis documented. It appears he had cystoscopy and retrogrades by Dr. 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 The medical record documents him taking finasteride and Flomax. It is felt that he has a neuroendocrine tumor of the prostatic urethra with metastases to the lung and liver. He declined radiation. He declined chemotherapy. Hospice care has been discussed.   When I reviewed the operative note from May 15 the patient had tumor in the proximal penile urethra. It was resected. Following resection the 24 [place] scope easily entered the bladder.  The patient does void with stopping and starting. He noted a little bit of blood. He feels comfortable.  Modifying factors: There are no other modifying factors  Associated signs and symptoms: There are no other associated signs and symptoms Aggravating and relieving factors: There are no other aggravating or relieving factors Severity: Moderate Duration: Persistent   PMH: Past Medical History:  Diagnosis Date  . Adenomatous polyps   . Arthritis    right knee  . Carotid artery occlusion   . Coronary artery disease   . Coronary atherosclerosis of artery bypass graft   . Diabetes mellitus without complication ([place])   . Dysrhythmia   . GERD (gastroesophageal reflux disease)   . History of kidney stones   . Hyperlipidemia   . Hypertension   . Prostate cancer ([place])   . Prostate cancer [facility])     Surgical History: Past Surgical History:  Procedure Laterality Date  . COLON RESECTION     polyps  . COLONOSCOPY WITH PROPOFOL N/A 06/24/2015   Procedure: COLONOSCOPY WITH PROPOFOL;  Surgeon: Lollie Sails, MD;  Location: [facility] ENDOSCOPY;  Service: Endoscopy;  Laterality: N/A;  . CORONARY ARTERY BYPASS GRAFT     1996 at [place] 2 vessels  . CYSTOSCOPY W/ RETROGRADES Left 01/05/2017   Procedure: CYSTOSCOPY WITH RETROGRADE PYELOGRAM;  Surgeon: Royston Cowper, MD;  Location: [facility] ORS;  Service: Urology;  Laterality: Left;  . ENDARTERECTOMY Right 09/25/2015   Procedure: ENDARTERECTOMY CAROTID;  Surgeon: Algernon Huxley, MD;  Location: [facility] ORS;  Service: Vascular;  Laterality: Right;  . IVC FILTER INSERTION N/A 01/11/2017   Procedure: IVC Filter Insertion;  Surgeon: Algernon Huxley, MD;  Location: [place] CV LAB;  Service: Cardiovascular;  Laterality: N/A;  . LITHOTRIPSY    . LOWER EXTREMITY ANGIOGRAPHY Left 12/31/2016   Procedure: Lower Extremity Angiography;  Surgeon: Algernon Huxley, MD;  Location: [place] CV LAB;  Service: Cardiovascular;  Laterality: Left;  . PERIPHERAL VASCULAR THROMBECTOMY Left 01/11/2017   Procedure: Peripheral Vascular Thrombectomy;  Surgeon: Algernon Huxley, MD;  Location: [place] CV LAB;  Service: Cardiovascular;  Laterality: Left;  . Removal of Testicles    . TRANSURETHRAL RESECTION OF BLADDER TUMOR  01/05/2017   Procedure: TRANSURETHRAL RESECTION OF URETHRAL TUMOR;  Surgeon: Royston Cowper, MD;  Location: [facility] ORS;  Service: Urology;;  . URETEROSCOPY N/A 01/05/2017   Procedure: URETEROSCOPY;  Surgeon: Royston Cowper, MD;  Location: [facility] ORS;  Service: Urology;  Laterality: N/A;    Home Medications:  Allergies as of 02/22/2017      Reactions   Percocet [oxycodone-acetaminophen] Rash   Skin peels      Medication List       Accurate as of 02/22/17 10:30 AM. Always use your most recent med list.          aspirin 81 MG tablet Take 81 mg by mouth daily.   atenolol 25 MG tablet Commonly known as:  TENORMIN Take 25 mg by mouth daily.   docusate sodium 100 MG capsule Commonly known as:   COLACE Take 2 capsules (200 mg total) by mouth 2 (two) times daily.   ELIQUIS 5 MG Tabs tablet Generic drug:  apixaban Take 5 mg by mouth 2 (two) times daily.   fentaNYL 25 MCG/HR patch Commonly known as:  DURAGESIC - dosed mcg/hr Place 1 patch (25 mcg total) onto the skin every 3 (three) days.   finasteride 5 MG tablet Commonly known as:  PROSCAR Take 5 mg by mouth daily.   glipiZIDE 5 MG tablet Commonly known as:  GLUCOTROL Take 5 mg by mouth daily before breakfast.   HYDROcodone-acetaminophen 5-325 MG tablet Commonly known as:  NORCO/VICODIN Take 1 tablet by mouth every 6 (six) hours as needed for moderate pain.   LORazepam 1 MG tablet Commonly known as:  ATIVAN Take 1 mg by mouth every 8 (eight) hours.   lovastatin 20 MG tablet Commonly known as:  MEVACOR Take 20 mg by mouth daily.   omeprazole 20 MG capsule Commonly known as:  PRILOSEC Take 20 mg by mouth every other day.   sertraline 50 MG tablet Commonly known as:  ZOLOFT Take 50 mg by mouth daily.   sitaGLIPtin 100 MG tablet Commonly known as:  JANUVIA Take 100 mg by mouth daily.   tamsulosin 0.4 MG Caps capsule Commonly known as:  FLOMAX Take 0.4 mg by mouth daily after supper.       Allergies:  Allergies  Allergen Reactions  . Percocet [Oxycodone-Acetaminophen] Rash    Skin peels    Family History: Family History  Problem Relation Age of Onset  . Family history unknown: Yes    Social History:  reports that he has been smoking Cigarettes.  He has a 25.00 pack-year smoking history. He has never used smokeless tobacco. He reports that he drinks alcohol. He reports that he does not use drugs.  ROS:                                        Physical Exam: BP (!) 155/65   Pulse 76   Ht 5\' 9"  (1.753 m)   Wt 202 lb (91.6 kg)   BMI 29.83 kg/m   Constitutional:  Alert and oriented, No acute distress. HEENT:  AT, moist mucus membranes.  Trachea midline, no  masses. Cardiovascular: No clubbing, cyanosis, or edema. Respiratory: Normal respiratory effort, no increased work of breathing. GI: Abdomen is soft, nontender, nondistended, no abdominal masses GU:  No bladder fullness on bladder examination. He was comfortable Skin: No rashes, bruises or suspicious lesions. Lymph: No cervical or inguinal adenopathy. Neurologic: Grossly intact, no focal deficits, moving all 4 extremities. Psychiatric: Normal mood and affect.  Laboratory Data: Lab Results  Component Value Date   WBC 8.8 02/09/2017   HGB 12.4 (L) 02/09/2017   HCT 36.8 (L) 02/09/2017   MCV 85.6 02/09/2017   PLT 176 02/09/2017    Lab  Results  Component Value Date   CREATININE 1.33 (H) 02/09/2017    No results found for: PSA  Urinalysis No results found for: COLORURINE, APPEARANCEUR, LABSPEC, PHURINE, GLUCOSEU, HGBUR, BILIRUBINUR, KETONESUR, PROTEINUR, UROBILINOGEN, NITRITE, LEUKOCYTESUR  Pertinent Imaging: none  Assessment & Plan:  Clinically the patient was scanned for 300 mL. He did not need a catheter at this stage but we discussed the option. We both shows no catheter. We will see him when necessary. He would go the emergency room if he developed acute painful retention  There are no diagnoses linked to this encounter.  No Follow-up on file.  Reece Packer, MD  [facility] [address] [place], [place] [address] [phone]

## 2017-02-22 NOTE — Telephone Encounter (Signed)
Asking if it is ok to wrap his swollen left lower extremity with an IT trainer.Please advise

## 2017-02-22 NOTE — Telephone Encounter (Signed)
OK per Dr Grayland Ormond, message left on Lisa's VM

## 2017-02-25 ENCOUNTER — Other Ambulatory Visit: Payer: Self-pay | Admitting: *Deleted

## 2017-02-25 ENCOUNTER — Ambulatory Visit (INDEPENDENT_AMBULATORY_CARE_PROVIDER_SITE_OTHER): Payer: Medicare Other

## 2017-02-25 VITALS — BP 136/62 | HR 79 | Ht 69.0 in | Wt 208.3 lb

## 2017-02-25 DIAGNOSIS — R339 Retention of urine, unspecified: Secondary | ICD-10-CM | POA: Diagnosis not present

## 2017-02-25 DIAGNOSIS — C61 Malignant neoplasm of prostate: Secondary | ICD-10-CM

## 2017-02-25 LAB — URINALYSIS, COMPLETE
Bilirubin, UA: NEGATIVE
Glucose, UA: NEGATIVE
Ketones, UA: NEGATIVE
LEUKOCYTES UA: NEGATIVE
Nitrite, UA: NEGATIVE
PH UA: 5.5 (ref 5.0–7.5)
Specific Gravity, UA: 1.015 (ref 1.005–1.030)
Urobilinogen, Ur: 0.2 mg/dL (ref 0.2–1.0)

## 2017-02-25 LAB — MICROSCOPIC EXAMINATION
BACTERIA UA: NONE SEEN
EPITHELIAL CELLS (NON RENAL): NONE SEEN /HPF (ref 0–10)

## 2017-02-25 LAB — BLADDER SCAN AMB NON-IMAGING

## 2017-02-25 MED ORDER — HYDROCODONE-ACETAMINOPHEN 5-325 MG PO TABS: 1.0000 | ORAL_TABLET | Freq: Four times a day (QID) | ORAL | 0 refills | Status: DC | PRN

## 2017-02-25 NOTE — Progress Notes (Signed)
Patient present today due to urinary retention. PVR was over 561ml and per Zara Council, PAC a cath was placed    Simple Catheter Placement  Due to urinary retention patient is present today for a foley cath placement.  Patient was cleaned and prepped in a sterile fashion with betadine and lidocaine jelly 2% was instilled into the urethra.  A 16 FR foley catheter was inserted, urine return was noted  511ml, urine was dark yellow in color.  The balloon was filled with 10cc of sterile water.  A night bag was attached for drainage. Patient was also given a night bag to take home and was given instruction on how to change from one bag to another.  Patient was given instruction on proper catheter care.  Patient tolerated well, complications were noted as: a blood clot was noted in urine return during placement, possible from recent surgery may be possible cause for incomplete emptying. Some blood noted on the outside of the foley and around the meatus. It was explained to the patient that this was ok, if cath stops draining to call the office for irrigation.   Preformed by: Fonnie Jarvis, CMA  Additional notes/ Follow up: 1 month cath change

## 2017-03-08 ENCOUNTER — Other Ambulatory Visit: Payer: Self-pay | Admitting: *Deleted

## 2017-03-08 DIAGNOSIS — C61 Malignant neoplasm of prostate: Secondary | ICD-10-CM

## 2017-03-08 MED ORDER — HYDROCODONE-ACETAMINOPHEN 5-325 MG PO TABS: 1.0000 | ORAL_TABLET | Freq: Four times a day (QID) | ORAL | 0 refills | Status: AC | PRN

## 2017-03-14 NOTE — Progress Notes (Signed)
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  INTERVAL HISTORY: Patient returns to clinic today for further evaluation. He continues to decline treatment and remains on hospice. Patient states his pain is still uncontrolled, but he otherwise feels well. He has persistent lymphedema of his left leg. He has no neurologic complaints. He denies any recent fevers. He denies any chest pain, shortness of breath, cough, or hemoptysis. He has a fair appetite and denies any nausea, vomiting, constipation, or diarrhea. Patient offers no further specific complaints today.  REVIEW OF SYSTEMS:   Review of Systems  Constitutional: Negative.  Negative for fever, malaise/fatigue and weight loss.  Respiratory: Negative.  Negative for cough and shortness of breath.   Cardiovascular: Positive for leg swelling. Negative for chest pain.  Gastrointestinal: Positive for abdominal pain. Negative for blood in stool, constipation and melena.  Genitourinary: Negative.   Musculoskeletal: Negative.   Skin: Negative.  Negative for rash.  Neurological: Negative for sensory change and weakness.  Psychiatric/Behavioral: The patient is nervous/anxious.     As per HPI. Otherwise, a complete review of systems is negative.  PAST MEDICAL HISTORY: Past Medical History:  Diagnosis Date  . Adenomatous polyps   . Arthritis    right knee  . Carotid artery occlusion   . Coronary artery disease   . Coronary atherosclerosis of artery bypass graft   . Diabetes mellitus without complication ([place])   . Dysrhythmia   . GERD (gastroesophageal reflux disease)   . History of kidney stones   . Hyperlipidemia   . Hypertension   . Prostate  cancer ([place])   . Prostate cancer ([place])     PAST SURGICAL HISTORY: Past Surgical History:  Procedure Laterality Date  . COLON RESECTION     polyps  . COLONOSCOPY WITH PROPOFOL N/A 06/24/2015   Procedure: COLONOSCOPY WITH PROPOFOL;  Surgeon: Lollie Sails, MD;  Location: [facility] ENDOSCOPY;  Service: Endoscopy;  Laterality: N/A;  . CORONARY ARTERY BYPASS GRAFT     1996 at [place] 2 vessels  . CYSTOSCOPY W/ RETROGRADES Left 01/05/2017   Procedure: CYSTOSCOPY WITH RETROGRADE PYELOGRAM;  Surgeon: Royston Cowper, MD;  Location: [facility] ORS;  Service: Urology;  Laterality: Left;  . ENDARTERECTOMY Right 09/25/2015   Procedure: ENDARTERECTOMY CAROTID;  Surgeon: Algernon Huxley, MD;  Location: [facility] ORS;  Service: Vascular;  Laterality: Right;  . IVC FILTER INSERTION N/A 01/11/2017   Procedure: IVC Filter Insertion;  Surgeon: Algernon Huxley, MD;  Location: [place] CV LAB;  Service: Cardiovascular;  Laterality: N/A;  . LITHOTRIPSY    . LOWER EXTREMITY ANGIOGRAPHY Left 12/31/2016   Procedure: Lower Extremity Angiography;  Surgeon: Algernon Huxley, MD;  Location: [place] CV LAB;  Service: Cardiovascular;  Laterality: Left;  . PERIPHERAL VASCULAR THROMBECTOMY Left 01/11/2017   Procedure: Peripheral Vascular Thrombectomy;  Surgeon: Algernon Huxley, MD;  Location: [place] CV LAB;  Service: Cardiovascular;  Laterality: Left;  . Removal of Testicles    . TRANSURETHRAL RESECTION OF BLADDER TUMOR  01/05/2017   Procedure: TRANSURETHRAL RESECTION OF URETHRAL TUMOR;  Surgeon: Royston Cowper, MD;  Location: [facility] ORS;  Service: Urology;;  . URETEROSCOPY N/A 01/05/2017   Procedure: URETEROSCOPY;  Surgeon: Royston Cowper, MD;  Location: [facility]  ORS;  Service: Urology;  Laterality: N/A;    FAMILY HISTORY: Family History  Problem Relation Age of Onset  . Family history unknown: Yes    ADVANCED DIRECTIVES (Y/N):  N  HEALTH MAINTENANCE: Social History  Substance Use Topics  . Smoking status: Current Every Day  Smoker    Packs/day: 0.50    Years: 50.00    Types: Cigarettes  . Smokeless tobacco: Never Used  . Alcohol use Yes     Comment: rare     Colonoscopy:  PAP:  Bone density:  Lipid panel:  Allergies  Allergen Reactions  . Percocet [Oxycodone-Acetaminophen] Rash    Skin peels    Current Outpatient Prescriptions  Medication Sig Dispense Refill  . apixaban (ELIQUIS) 5 MG TABS tablet Take 5 mg by mouth 2 (two) times daily.    Marland Kitchen docusate sodium (COLACE) 100 MG capsule Take 2 capsules (200 mg total) by mouth 2 (two) times daily. 120 capsule 3  . HYDROcodone-acetaminophen (NORCO/VICODIN) 5-325 MG tablet Take 1-2 tablets by mouth every 6 (six) hours as needed for moderate pain. 90 tablet 0  . LORazepam (ATIVAN) 1 MG tablet Take 1 mg by mouth every 8 (eight) hours.    . sitaGLIPtin (JANUVIA) 100 MG tablet Take 100 mg by mouth daily.    Marland Kitchen aspirin 81 MG tablet Take 81 mg by mouth daily.    Marland Kitchen atenolol (TENORMIN) 25 MG tablet Take 25 mg by mouth daily.    . fentaNYL (DURAGESIC - DOSED MCG/HR) 50 MCG/HR Place 1 patch (50 mcg total) onto the skin every 3 (three) days. HOSPICE PATIENT 5 patch 0  . finasteride (PROSCAR) 5 MG tablet Take 5 mg by mouth daily.    Marland Kitchen glipiZIDE (GLUCOTROL) 5 MG tablet Take 5 mg by mouth daily before breakfast.     . lovastatin (MEVACOR) 20 MG tablet Take 20 mg by mouth daily.     Marland Kitchen omeprazole (PRILOSEC) 20 MG capsule Take 20 mg by mouth every other day.    . Oxycodone HCl 10 MG TABS Take 1 tablet (10 mg total) by mouth every 6 (six) hours as needed. HOSPICE PATIENT 60 tablet 0  . sertraline (ZOLOFT) 50 MG tablet Take 50 mg by mouth daily.    . tamsulosin (FLOMAX) 0.4 MG CAPS capsule Take 0.4 mg by mouth daily after supper.      Current Facility-Administered Medications  Medication Dose Route Frequency Provider Last Rate Last Dose  . ceFAZolin (ANCEF) IVPB 1 g/50 mL premix  1 g Intravenous Once Algernon Huxley, MD        OBJECTIVE: Vitals:   03/15/17 1041  BP: 128/62   Pulse: 87  Resp: 18  Temp: 97.8 F (36.6 C)     Body mass index is 29.09 kg/m.    ECOG FS:1 - Symptomatic but completely ambulatory  General: Well-developed, well-nourished, no acute distress. Eyes: Pink conjunctiva, anicteric sclera. Lungs: Clear to auscultation bilaterally. Heart: Regular rate and rhythm. No rubs, murmurs, or gallops. Abdomen: Soft, nontender, nondistended. No organomegaly noted, normoactive bowel sounds. Chronic Foley catheter in place. Musculoskeletal: Left leg lymphedema Neuro: Alert, answering all questions appropriately. Cranial nerves grossly intact. Skin: No rashes or petechiae noted. Psych: Normal affect.  LAB RESULTS:  Lab Results  Component Value Date   NA 133 (L) 02/09/2017   K 4.6 02/09/2017   CL 101 02/09/2017   CO2 24 02/09/2017   GLUCOSE 211 (H) 02/09/2017   BUN 23 (H) 02/09/2017   CREATININE 1.33 (H) 02/09/2017  CALCIUM 9.3 02/09/2017   PROT 6.9 02/09/2017   ALBUMIN 4.1 02/09/2017   AST 22 02/09/2017   ALT 12 (L) 02/09/2017   ALKPHOS 65 02/09/2017   BILITOT 0.5 02/09/2017   GFRNONAA 50 (L) 02/09/2017   GFRAA 57 (L) 02/09/2017    Lab Results  Component Value Date   WBC 8.8 02/09/2017   NEUTROABS 6.1 02/09/2017   HGB 12.4 (L) 02/09/2017   HCT 36.8 (L) 02/09/2017   MCV 85.6 02/09/2017   PLT 176 02/09/2017     STUDIES: No results found.  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  PLAN:    1. 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 After lengthy discussion with the patient, he again declined systemic chemotherapy using a platinum based regimen along with etoposide. He expressed understanding that his treatment options are limited. He also declined palliative XRT to his pelvic area for his persistent pain. Patient wishes to stay enrolled in  hospice. No further follow-up has been made. 2. Pain: Patient declined XRT as above. He was given a prescription for fentanyl 50 g every 72 hours as well as 10 mg oxycodone every 6 hours for breakthrough pain today. Patient was also instructed that he can use ibuprofen intermittently as needed. 3. Anxiety: Continue lorazepam as prescribed.  Approximately 30 minutes was spent in discussion of which greater than 50% was consultation.  Patient expressed understanding and was in agreement with this plan. He also understands that He can call clinic at any time with any questions, concerns, or complaints.   Lloyd Huger, MD   03/15/2017 11:13 AM

## 2017-03-15 ENCOUNTER — Inpatient Hospital Stay: Attending: Oncology | Admitting: Oncology

## 2017-03-15 VITALS — BP 128/62 | HR 87 | Temp 97.8°F | Resp 18 | Wt 197.0 lb

## 2017-03-15 DIAGNOSIS — Z7982 Long term (current) use of aspirin: Secondary | ICD-10-CM | POA: Diagnosis not present

## 2017-03-15 DIAGNOSIS — C7800 Secondary malignant neoplasm of unspecified lung: Secondary | ICD-10-CM | POA: Diagnosis not present

## 2017-03-15 DIAGNOSIS — Z87442 Personal history of urinary calculi: Secondary | ICD-10-CM | POA: Diagnosis not present

## 2017-03-15 DIAGNOSIS — C7A8 Other malignant neuroendocrine tumors: Secondary | ICD-10-CM | POA: Diagnosis not present

## 2017-03-15 DIAGNOSIS — E785 Hyperlipidemia, unspecified: Secondary | ICD-10-CM | POA: Insufficient documentation

## 2017-03-15 DIAGNOSIS — I10 Essential (primary) hypertension: Secondary | ICD-10-CM | POA: Diagnosis not present

## 2017-03-15 DIAGNOSIS — Z7984 Long term (current) use of oral hypoglycemic drugs: Secondary | ICD-10-CM | POA: Insufficient documentation

## 2017-03-15 DIAGNOSIS — C787 Secondary malignant neoplasm of liver and intrahepatic bile duct: Secondary | ICD-10-CM

## 2017-03-15 DIAGNOSIS — F419 Anxiety disorder, unspecified: Secondary | ICD-10-CM | POA: Diagnosis not present

## 2017-03-15 DIAGNOSIS — K219 Gastro-esophageal reflux disease without esophagitis: Secondary | ICD-10-CM | POA: Insufficient documentation

## 2017-03-15 DIAGNOSIS — I890 Lymphedema, not elsewhere classified: Secondary | ICD-10-CM | POA: Insufficient documentation

## 2017-03-15 DIAGNOSIS — E119 Type 2 diabetes mellitus without complications: Secondary | ICD-10-CM | POA: Insufficient documentation

## 2017-03-15 DIAGNOSIS — I2510 Atherosclerotic heart disease of native coronary artery without angina pectoris: Secondary | ICD-10-CM | POA: Diagnosis not present

## 2017-03-15 DIAGNOSIS — F17210 Nicotine dependence, cigarettes, uncomplicated: Secondary | ICD-10-CM

## 2017-03-15 DIAGNOSIS — Z79899 Other long term (current) drug therapy: Secondary | ICD-10-CM | POA: Diagnosis not present

## 2017-03-15 DIAGNOSIS — C61 Malignant neoplasm of prostate: Secondary | ICD-10-CM

## 2017-03-15 MED ORDER — FENTANYL 50 MCG/HR TD PT72: 50.0000 ug | MEDICATED_PATCH | TRANSDERMAL | 0 refills | Status: DC

## 2017-03-15 MED ORDER — OXYCODONE HCL 10 MG PO TABS: 10.0000 mg | ORAL_TABLET | Freq: Four times a day (QID) | ORAL | 0 refills | Status: AC | PRN

## 2017-03-15 NOTE — Progress Notes (Signed)
Patient is here for follow up, he mentions he has pain in his left leg and in his abdomen.

## 2017-03-26 ENCOUNTER — Telehealth: Payer: Self-pay | Admitting: *Deleted

## 2017-03-26 MED ORDER — CYCLOBENZAPRINE HCL 5 MG PO TABS: 5.0000 mg | ORAL_TABLET | Freq: Three times a day (TID) | ORAL | 0 refills | Status: AC | PRN

## 2017-03-26 MED ORDER — FENTANYL 50 MCG/HR TD PT72: 50.0000 ug | MEDICATED_PATCH | TRANSDERMAL | 0 refills | Status: DC

## 2017-03-26 NOTE — Telephone Encounter (Signed)
Medications ordered and sent to pharmacy.  

## 2017-04-06 ENCOUNTER — Other Ambulatory Visit: Payer: Self-pay

## 2017-04-06 MED ORDER — FENTANYL 100 MCG/HR TD PT72: 100.0000 ug | MEDICATED_PATCH | TRANSDERMAL | 0 refills | Status: DC

## 2017-04-13 ENCOUNTER — Other Ambulatory Visit: Payer: Self-pay | Admitting: *Deleted

## 2017-04-13 MED ORDER — MORPHINE SULFATE (CONCENTRATE) 20 MG/ML PO SOLN: ORAL | 0 refills | Status: DC

## 2017-04-20 ENCOUNTER — Other Ambulatory Visit: Payer: Self-pay | Admitting: *Deleted

## 2017-04-20 ENCOUNTER — Telehealth: Payer: Self-pay | Admitting: *Deleted

## 2017-04-20 MED ORDER — FENTANYL 100 MCG/HR TD PT72: 100.0000 ug | MEDICATED_PATCH | TRANSDERMAL | 0 refills | Status: DC

## 2017-04-20 MED ORDER — FUROSEMIDE 20 MG PO TABS: 20.0000 mg | ORAL_TABLET | Freq: Every day | ORAL | 1 refills | Status: DC

## 2017-04-20 NOTE — Telephone Encounter (Signed)
Stephanie with hospice called reporting that patient is having 3+ pitting edema and his legs are weeping and very tight. Asking for lasix prescription. Patient also needs refill of his Fentanyl 100 mcg. Please advise

## 2017-04-20 NOTE — Telephone Encounter (Signed)
Kenneth [name] states she just took over Kenneth [name] care from another nurse asn was told that the nurses are not to change his foley cath which is in need of replacing. Dr Eliberto Ivory is going to change it, but needs something from [place] as to why he needs to be the one to change it. Something was mentioned about the place of his tumor.  Fax note to Dr Yves Dill [phone] advise

## 2017-04-20 NOTE — Telephone Encounter (Signed)
Phone call to [facility] and she will contact Dr Quay Burow office to look in EMR

## 2017-04-20 NOTE — Telephone Encounter (Signed)
OK to refill fentanyl and lasix 20 mg daily per VO Dr Grayland Ormond. Colletta Maryland informed medications orders and faxed/ escribed

## 2017-04-20 NOTE — Telephone Encounter (Signed)
I do not know who told the nurses initially that they could not change the Foley. I will defer to Dr. Eliberto Ivory for that decision.

## 2017-04-22 ENCOUNTER — Other Ambulatory Visit: Payer: Self-pay | Admitting: *Deleted

## 2017-04-22 MED ORDER — MORPHINE SULFATE (CONCENTRATE) 20 MG/ML PO SOLN: ORAL | 0 refills | Status: DC

## 2017-04-27 ENCOUNTER — Telehealth: Payer: Self-pay | Admitting: *Deleted

## 2017-04-27 NOTE — Telephone Encounter (Signed)
She will ask patient

## 2017-04-27 NOTE — Telephone Encounter (Signed)
Does he have nephrologist or cardiologist they could ask?

## 2017-04-27 NOTE — Telephone Encounter (Signed)
Hospice called to report that his legs continue to have 3+ edema with some weeping. Lasix  20 mg is not helping and he actually increased it to 40 mg for a couple of days and it did not help. He is keeping his feet/ legs elevated. Asking what other suggestions we have for this problem. States he is not in pain or discomfort, it is just making him slower to get around. Please advise

## 2017-04-30 ENCOUNTER — Ambulatory Visit (INDEPENDENT_AMBULATORY_CARE_PROVIDER_SITE_OTHER): Payer: Medicare Other | Admitting: Vascular Surgery

## 2017-04-30 ENCOUNTER — Encounter (INDEPENDENT_AMBULATORY_CARE_PROVIDER_SITE_OTHER): Payer: Medicare Other

## 2017-05-03 ENCOUNTER — Other Ambulatory Visit: Payer: Self-pay | Admitting: *Deleted

## 2017-05-03 MED ORDER — FENTANYL 100 MCG/HR TD PT72: 100.0000 ug | MEDICATED_PATCH | TRANSDERMAL | 0 refills | Status: DC

## 2017-05-11 ENCOUNTER — Telehealth: Payer: Self-pay | Admitting: *Deleted

## 2017-05-11 MED ORDER — MORPHINE SULFATE (CONCENTRATE) 20 MG/ML PO SOLN: ORAL | 0 refills | Status: DC

## 2017-05-11 MED ORDER — FUROSEMIDE 20 MG PO TABS: 20.0000 mg | ORAL_TABLET | Freq: Two times a day (BID) | ORAL | 1 refills | Status: AC

## 2017-05-11 NOTE — Telephone Encounter (Signed)
Increase lasix to 20 mg twice per day. Can you also please find out who was managing his edema prior to be on hospice.

## 2017-05-11 NOTE — Addendum Note (Signed)
Addended by: Betti Cruz on: 05/11/2017 10:27 AM   Modules accepted: Orders

## 2017-05-11 NOTE — Telephone Encounter (Signed)
Colletta Maryland advised of increase in Lasix to 20 mg twice a day And asked who was managing his edema prior, she stated no one was. She then stated that wife called and needs Roxanol refill

## 2017-05-11 NOTE — Telephone Encounter (Signed)
Patient continues to have 3+ edema in his legs, has a long history of lymphedema, but family insists after speaking with a family member who is a nurse that he be given higher dose of diuretics.He has lasix 20 mg on hand in home. Please advise if you want to increase his diuretic

## 2017-05-14 ENCOUNTER — Telehealth: Payer: Self-pay | Admitting: *Deleted

## 2017-05-14 ENCOUNTER — Other Ambulatory Visit: Payer: Self-pay

## 2017-05-14 MED ORDER — FENTANYL 100 MCG/HR TD PT72: 100.0000 ug | MEDICATED_PATCH | TRANSDERMAL | 0 refills | Status: AC

## 2017-05-14 MED ORDER — POTASSIUM CHLORIDE CRYS ER 20 MEQ PO TBCR: 20.0000 meq | EXTENDED_RELEASE_TABLET | Freq: Every day | ORAL | 2 refills | Status: AC

## 2017-05-14 MED ORDER — FENTANYL 25 MCG/HR TD PT72: 25.0000 ug | MEDICATED_PATCH | TRANSDERMAL | 0 refills | Status: DC

## 2017-05-14 NOTE — Telephone Encounter (Signed)
Increase fentanyl to 125. Ok for PO 20 meq Kcl daily.

## 2017-05-14 NOTE — Telephone Encounter (Signed)
Britt faxed over..Thanks

## 2017-05-14 NOTE — Telephone Encounter (Signed)
Printed and will fax over to the pharmacy

## 2017-05-14 NOTE — Telephone Encounter (Signed)
Hospice nurse called to report that patient is having increased pain in his low back and that he is taking 20 mg of Roxanol every 3 hours as well as Fentanyl 100 mcg every 3 days. Asking if the Fentanyl dose can be increased. He has also been taking 40 mg of lasix daily and is asking if he should be on potasium supplement. He has lost 3 pounds since starting the lasix. The wife does not see a difference in the swelling, but the patient states it does not feel as tight. Please advise

## 2017-05-25 ENCOUNTER — Other Ambulatory Visit: Payer: Self-pay | Admitting: *Deleted

## 2017-05-25 MED ORDER — MORPHINE SULFATE (CONCENTRATE) 20 MG/ML PO SOLN: ORAL | 0 refills | Status: AC

## 2017-05-25 MED ORDER — FENTANYL 25 MCG/HR TD PT72: 25.0000 ug | MEDICATED_PATCH | TRANSDERMAL | 0 refills | Status: AC

## 2017-06-24 DEATH — deceased

## 2018-01-18 ENCOUNTER — Ambulatory Visit (INDEPENDENT_AMBULATORY_CARE_PROVIDER_SITE_OTHER): Payer: Medicare Other | Admitting: Vascular Surgery

## 2018-01-18 ENCOUNTER — Encounter (INDEPENDENT_AMBULATORY_CARE_PROVIDER_SITE_OTHER): Payer: Medicare Other

## 2018-07-15 IMAGING — US US RENAL
1 series · 13 of 25 positions shown · non-contrast
Comparison: CT abdomen and pelvis October 08, 2014

CLINICAL DATA: Hematuria

EXAM:
RENAL / URINARY TRACT ULTRASOUND COMPLETE

[Series 1: us renal · 0.24mm/px · 13 of 42 slices shown]
[im 1/42]
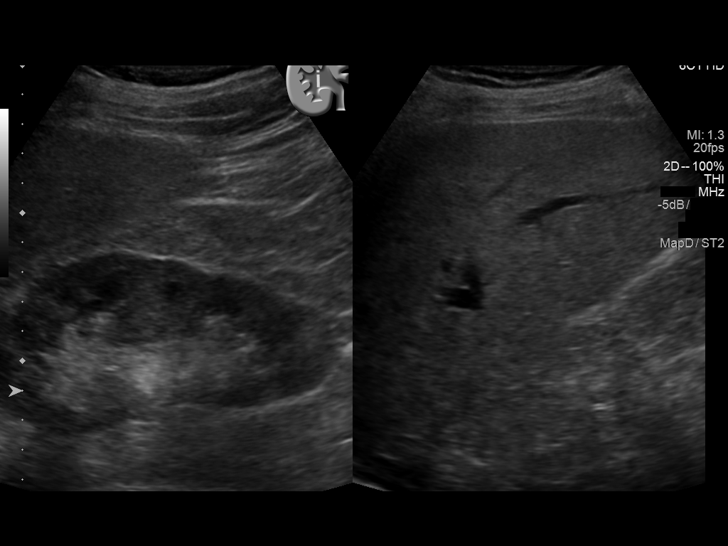
[im 4/42]
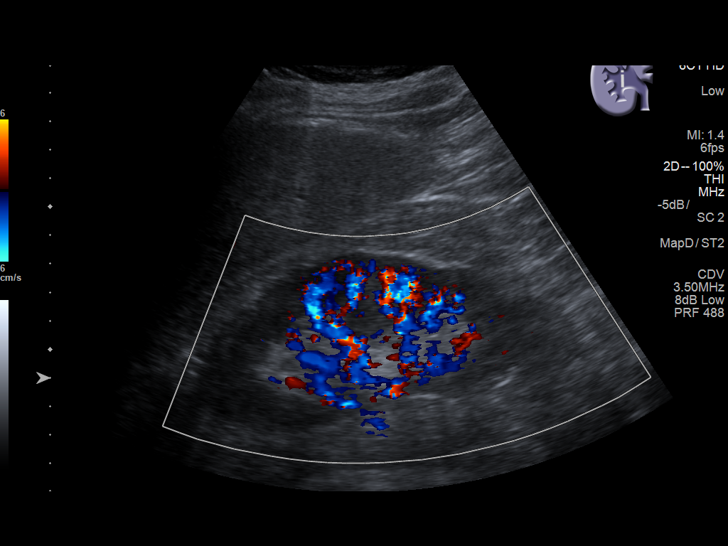
[im 7/42]
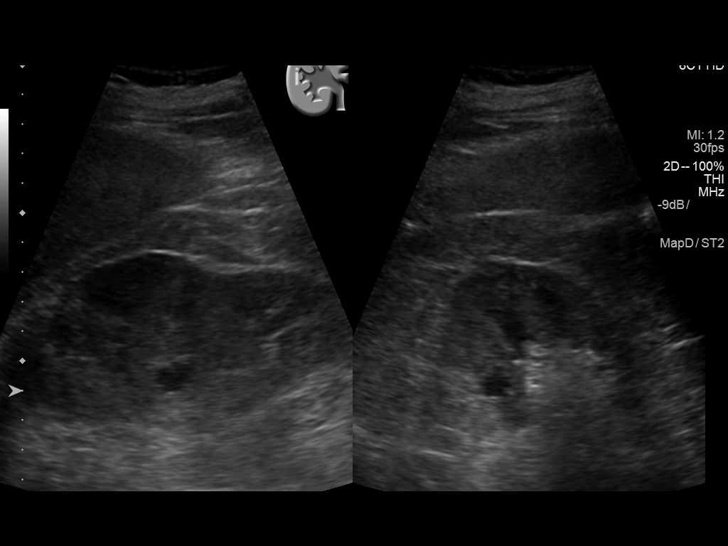
[im 11/42]
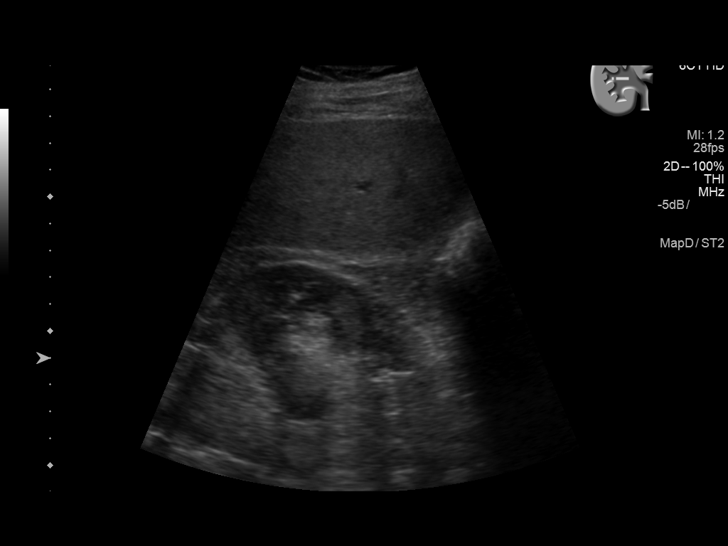
[im 14/42]
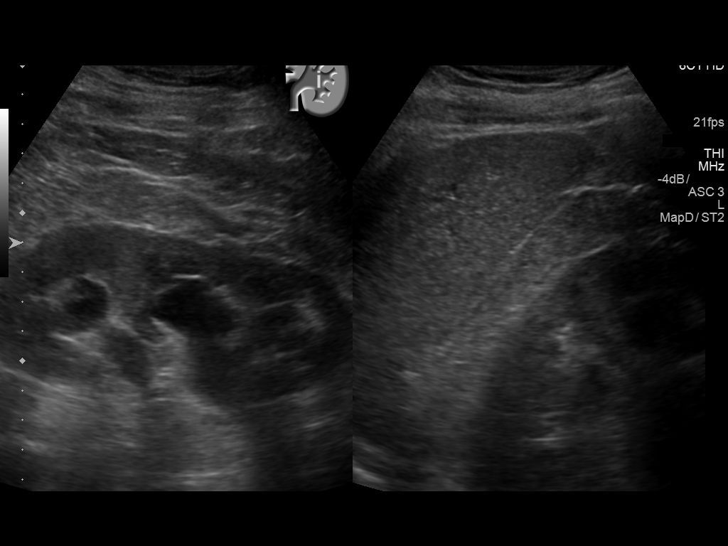
[im 18/42]
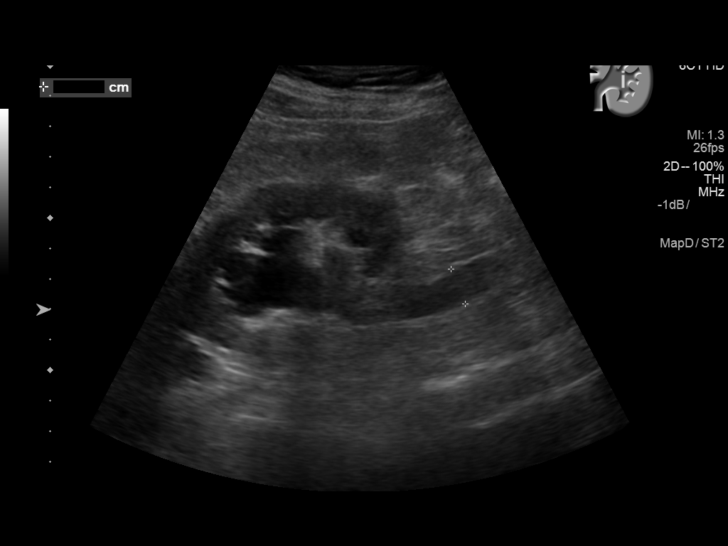
[im 21/42]
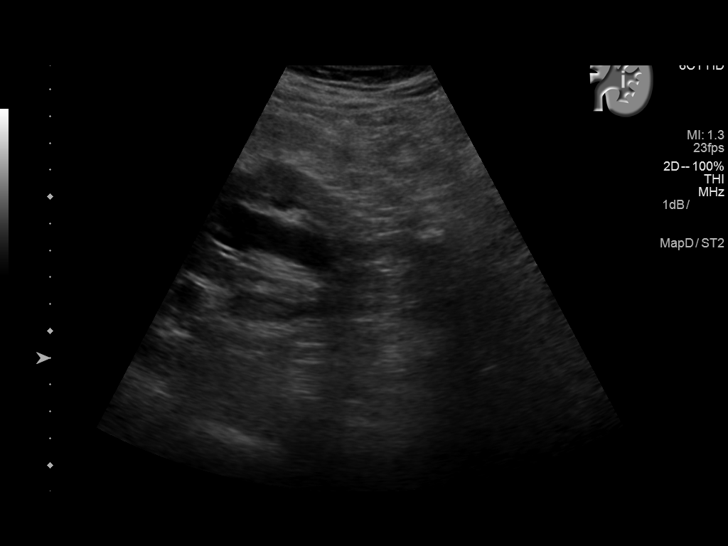
[im 24/42]
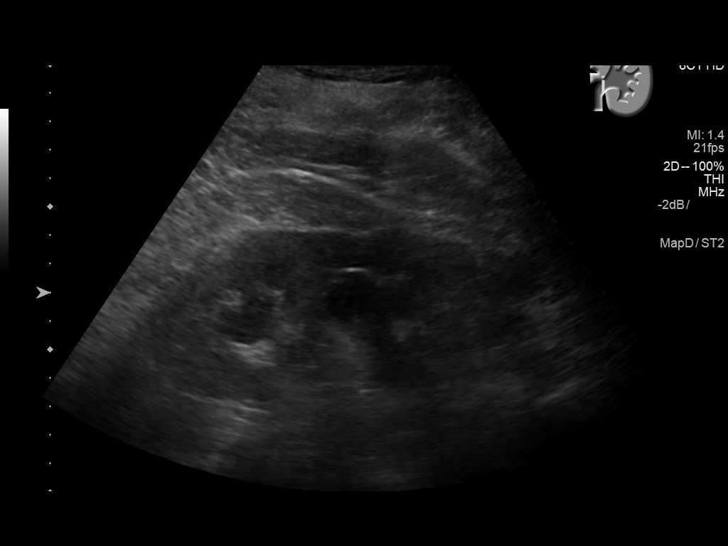
[im 28/42]
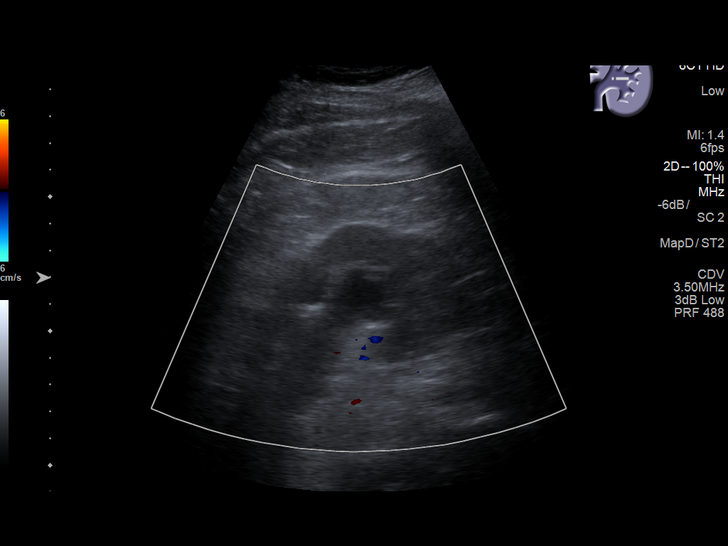
[im 31/42]
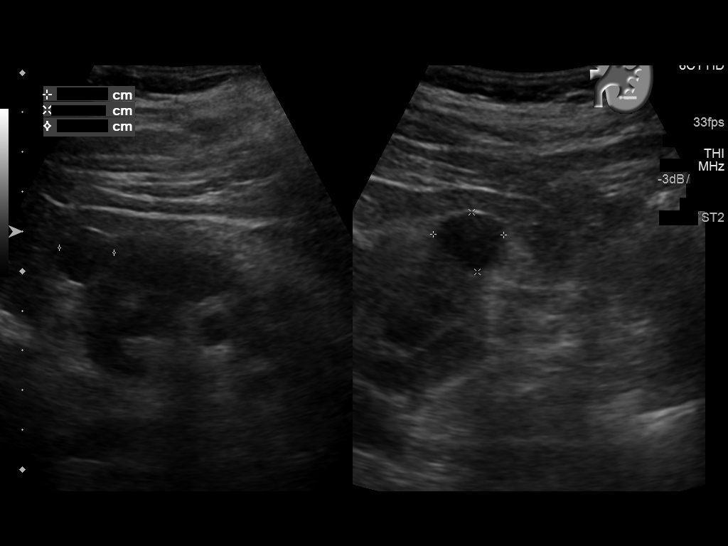
[im 35/42]
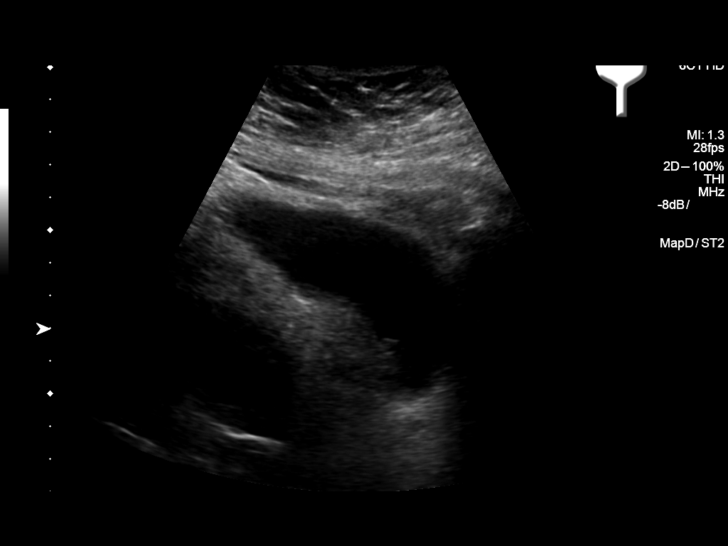
[im 38/42]
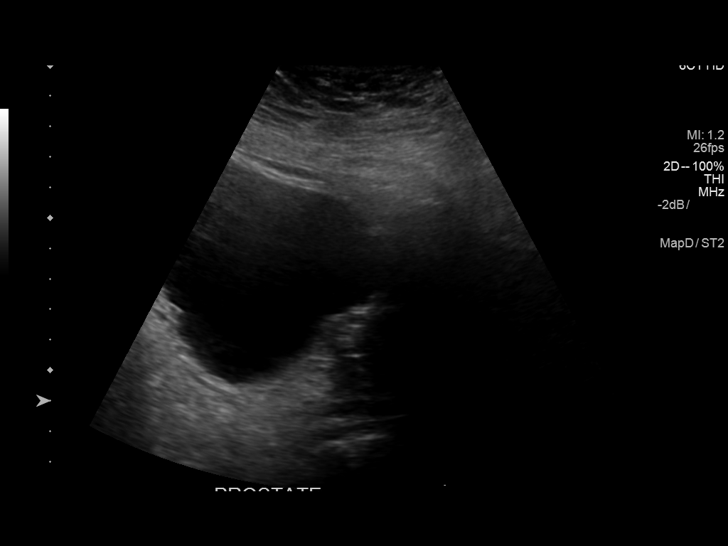
[im 42/42]
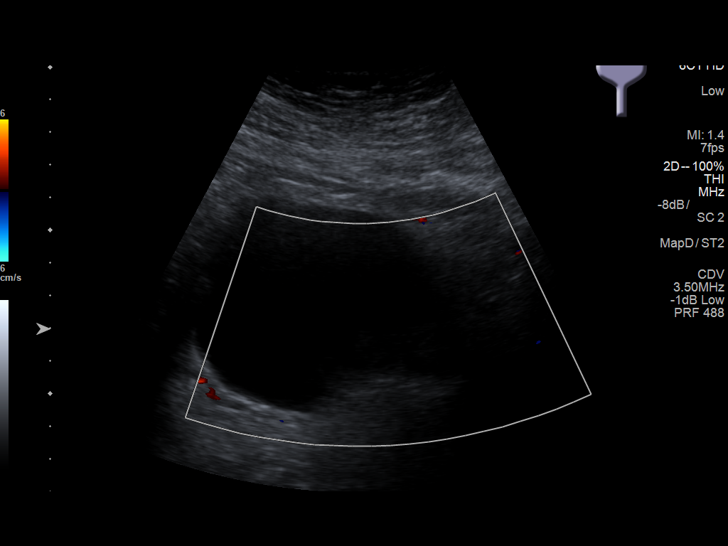

[13 of 25 positions shown; findings below may reference images not displayed]

FINDINGS: Right Kidney:

Length: 13.2 cm. Echogenicity and renal cortical thickness are
within normal limits. No perinephric fluid or hydronephrosis
visualized. There is a cyst in the mid right kidney measuring 1.4 x
1.1 x 1.0 cm. No sonographically demonstrable calculus or
ureterectasis.

Left Kidney:

Length: 13.3 cm. Echogenicity and renal cortical thickness are
within normal limits. No perinephric fluid visualized. There is
moderate pelvicaliectasis on the left. There is a cyst arising from
the lower pole left kidney measuring 1.8 x 1.5 x 1.7 cm. No
sonographically demonstrable calculus or ureterectasis.

Bladder:

Appears normal for degree of bladder distention. Flow from the right
ureter is noted within the urinary bladder by color Doppler
examination. No flow from the left side evident.
IMPRESSION: Hydronephrosis on the left without ureterectasis or demonstrable
site of obstruction. No flow seen from the distal left ureter into
the urinary bladder by color Doppler ultrasound. This finding may
warrant noncontrast enhanced CT to assess for potential calculus in
the left ureter.

Small cysts in each kidney.

Study otherwise unremarkable.

These results will be called to the ordering clinician or
representative by the Radiologist Assistant, and communication
documented in the PACS or zVision Dashboard.
